# Patient Record
Sex: Female | Born: 1965 | Race: White | Hispanic: No | Marital: Single | Smoking: Former smoker
Health system: Southern US, Community
[De-identification: ages and names within clinical notes are randomized; demographics above are authoritative.]

## PROBLEM LIST (undated history)

## (undated) DIAGNOSIS — K219 Gastro-esophageal reflux disease without esophagitis: Secondary | ICD-10-CM

## (undated) DIAGNOSIS — I2510 Atherosclerotic heart disease of native coronary artery without angina pectoris: Secondary | ICD-10-CM

## (undated) DIAGNOSIS — G4730 Sleep apnea, unspecified: Secondary | ICD-10-CM

## (undated) DIAGNOSIS — J349 Unspecified disorder of nose and nasal sinuses: Secondary | ICD-10-CM

## (undated) DIAGNOSIS — E785 Hyperlipidemia, unspecified: Secondary | ICD-10-CM

## (undated) DIAGNOSIS — I10 Essential (primary) hypertension: Secondary | ICD-10-CM

## (undated) DIAGNOSIS — Z8616 Personal history of COVID-19: Secondary | ICD-10-CM

## (undated) HISTORY — DX: Essential (primary) hypertension: I10

## (undated) HISTORY — DX: Unspecified disorder of nose and nasal sinuses: J34.9

## (undated) HISTORY — DX: Sleep apnea, unspecified: G47.30

## (undated) HISTORY — DX: Hyperlipidemia, unspecified: E78.5

## (undated) HISTORY — DX: Gastro-esophageal reflux disease without esophagitis: K21.9

## (undated) HISTORY — DX: Atherosclerotic heart disease of native coronary artery without angina pectoris: I25.10

## (undated) HISTORY — PX: WISDOM TOOTH EXTRACTION: SHX21

## (undated) HISTORY — DX: Personal history of COVID-19: Z86.16

## (undated) HISTORY — PX: COLPORRHAPHY: SHX921

---

## 2010-11-06 ENCOUNTER — Encounter: Admission: RE | Admit: 2010-11-06 | Discharge: 2010-11-06 | Payer: Self-pay | Admitting: Family Medicine

## 2010-11-21 ENCOUNTER — Encounter
Admission: RE | Admit: 2010-11-21 | Discharge: 2010-11-21 | Payer: Self-pay | Source: Home / Self Care | Attending: Family Medicine | Admitting: Family Medicine

## 2011-05-09 ENCOUNTER — Other Ambulatory Visit: Payer: Self-pay | Admitting: Family Medicine

## 2011-05-09 ENCOUNTER — Ambulatory Visit
Admission: RE | Admit: 2011-05-09 | Discharge: 2011-05-09 | Disposition: A | Discharge status: Home / Self Care | Payer: 59 | Source: Ambulatory Visit | Attending: Family Medicine | Admitting: Family Medicine

## 2011-05-09 DIAGNOSIS — R1310 Dysphagia, unspecified: Secondary | ICD-10-CM

## 2011-05-13 ENCOUNTER — Other Ambulatory Visit: Payer: Self-pay | Admitting: Family Medicine

## 2011-05-13 DIAGNOSIS — K219 Gastro-esophageal reflux disease without esophagitis: Secondary | ICD-10-CM

## 2011-05-14 ENCOUNTER — Ambulatory Visit
Admission: RE | Admit: 2011-05-14 | Discharge: 2011-05-14 | Disposition: A | Discharge status: Home / Self Care | Payer: 59 | Source: Ambulatory Visit | Attending: Family Medicine | Admitting: Family Medicine

## 2011-05-14 DIAGNOSIS — K219 Gastro-esophageal reflux disease without esophagitis: Secondary | ICD-10-CM

## 2012-01-20 ENCOUNTER — Other Ambulatory Visit: Payer: Self-pay | Admitting: Family Medicine

## 2012-01-20 DIAGNOSIS — Z1231 Encounter for screening mammogram for malignant neoplasm of breast: Secondary | ICD-10-CM

## 2012-01-30 ENCOUNTER — Ambulatory Visit
Admission: RE | Admit: 2012-01-30 | Discharge: 2012-01-30 | Disposition: A | Discharge status: Home / Self Care | Payer: 59 | Source: Ambulatory Visit | Attending: Family Medicine | Admitting: Family Medicine

## 2012-01-30 DIAGNOSIS — Z1231 Encounter for screening mammogram for malignant neoplasm of breast: Secondary | ICD-10-CM

## 2013-03-28 ENCOUNTER — Other Ambulatory Visit (HOSPITAL_COMMUNITY)
Admission: RE | Admit: 2013-03-28 | Discharge: 2013-03-28 | Disposition: A | Discharge status: Home / Self Care | Payer: 59 | Source: Ambulatory Visit | Attending: Family Medicine | Admitting: Family Medicine

## 2013-03-28 ENCOUNTER — Other Ambulatory Visit: Payer: Self-pay | Admitting: Family Medicine

## 2013-03-28 DIAGNOSIS — Z124 Encounter for screening for malignant neoplasm of cervix: Secondary | ICD-10-CM | POA: Insufficient documentation

## 2013-03-29 ENCOUNTER — Ambulatory Visit
Admission: RE | Admit: 2013-03-29 | Discharge: 2013-03-29 | Disposition: A | Discharge status: Home / Self Care | Payer: 59 | Source: Ambulatory Visit | Attending: Family Medicine | Admitting: Family Medicine

## 2013-04-08 ENCOUNTER — Other Ambulatory Visit: Payer: 59

## 2013-05-16 ENCOUNTER — Ambulatory Visit: Payer: Self-pay | Admitting: Nurse Practitioner

## 2013-05-16 ENCOUNTER — Encounter: Payer: Self-pay | Admitting: Nurse Practitioner

## 2014-03-06 ENCOUNTER — Other Ambulatory Visit: Payer: Self-pay

## 2014-03-06 DIAGNOSIS — Z1231 Encounter for screening mammogram for malignant neoplasm of breast: Secondary | ICD-10-CM

## 2014-04-03 ENCOUNTER — Ambulatory Visit
Admission: RE | Admit: 2014-04-03 | Discharge: 2014-04-03 | Disposition: A | Discharge status: Home / Self Care | Payer: 59 | Source: Ambulatory Visit

## 2014-04-03 DIAGNOSIS — Z1231 Encounter for screening mammogram for malignant neoplasm of breast: Secondary | ICD-10-CM

## 2014-05-26 ENCOUNTER — Other Ambulatory Visit: Payer: Self-pay | Admitting: Gastroenterology

## 2014-05-26 DIAGNOSIS — R6889 Other general symptoms and signs: Secondary | ICD-10-CM

## 2014-05-26 DIAGNOSIS — R1310 Dysphagia, unspecified: Secondary | ICD-10-CM

## 2014-05-26 DIAGNOSIS — R1011 Right upper quadrant pain: Secondary | ICD-10-CM

## 2014-05-26 DIAGNOSIS — R0989 Other specified symptoms and signs involving the circulatory and respiratory systems: Secondary | ICD-10-CM

## 2014-06-02 ENCOUNTER — Ambulatory Visit
Admission: RE | Admit: 2014-06-02 | Discharge: 2014-06-02 | Disposition: A | Discharge status: Home / Self Care | Payer: 59 | Source: Ambulatory Visit | Attending: Gastroenterology | Admitting: Gastroenterology

## 2014-06-02 DIAGNOSIS — R6889 Other general symptoms and signs: Secondary | ICD-10-CM

## 2014-06-02 DIAGNOSIS — R1011 Right upper quadrant pain: Secondary | ICD-10-CM

## 2014-06-02 DIAGNOSIS — R0989 Other specified symptoms and signs involving the circulatory and respiratory systems: Secondary | ICD-10-CM

## 2014-06-02 DIAGNOSIS — R1310 Dysphagia, unspecified: Secondary | ICD-10-CM

## 2016-06-25 ENCOUNTER — Other Ambulatory Visit: Payer: Self-pay | Admitting: Family Medicine

## 2016-06-25 DIAGNOSIS — Z1231 Encounter for screening mammogram for malignant neoplasm of breast: Secondary | ICD-10-CM

## 2016-07-01 ENCOUNTER — Ambulatory Visit
Admission: RE | Admit: 2016-07-01 | Discharge: 2016-07-01 | Disposition: A | Discharge status: Home / Self Care | Payer: 59 | Source: Ambulatory Visit | Attending: Family Medicine | Admitting: Family Medicine

## 2016-07-01 DIAGNOSIS — Z1231 Encounter for screening mammogram for malignant neoplasm of breast: Secondary | ICD-10-CM

## 2016-07-11 ENCOUNTER — Other Ambulatory Visit: Payer: Self-pay | Admitting: Gastroenterology

## 2016-07-11 DIAGNOSIS — R1011 Right upper quadrant pain: Secondary | ICD-10-CM

## 2016-07-21 ENCOUNTER — Other Ambulatory Visit: Payer: 59

## 2016-08-11 ENCOUNTER — Ambulatory Visit
Admission: RE | Admit: 2016-08-11 | Discharge: 2016-08-11 | Disposition: A | Discharge status: Home / Self Care | Payer: 59 | Source: Ambulatory Visit | Attending: Gastroenterology | Admitting: Gastroenterology

## 2016-08-11 DIAGNOSIS — R1011 Right upper quadrant pain: Secondary | ICD-10-CM

## 2016-09-04 ENCOUNTER — Other Ambulatory Visit (HOSPITAL_COMMUNITY): Payer: Self-pay | Admitting: Gastroenterology

## 2016-09-04 ENCOUNTER — Other Ambulatory Visit: Payer: Self-pay | Admitting: Gastroenterology

## 2016-09-04 DIAGNOSIS — R1011 Right upper quadrant pain: Secondary | ICD-10-CM

## 2016-09-19 ENCOUNTER — Ambulatory Visit (HOSPITAL_COMMUNITY): Payer: 59

## 2016-12-23 DIAGNOSIS — Z23 Encounter for immunization: Secondary | ICD-10-CM | POA: Diagnosis not present

## 2017-02-18 DIAGNOSIS — K625 Hemorrhage of anus and rectum: Secondary | ICD-10-CM | POA: Diagnosis not present

## 2017-02-18 DIAGNOSIS — R1011 Right upper quadrant pain: Secondary | ICD-10-CM | POA: Diagnosis not present

## 2017-02-27 ENCOUNTER — Encounter (HOSPITAL_COMMUNITY): Payer: 59

## 2017-03-10 ENCOUNTER — Ambulatory Visit (HOSPITAL_COMMUNITY): Payer: 59

## 2017-04-09 DIAGNOSIS — H5213 Myopia, bilateral: Secondary | ICD-10-CM | POA: Diagnosis not present

## 2017-04-09 DIAGNOSIS — H524 Presbyopia: Secondary | ICD-10-CM | POA: Diagnosis not present

## 2017-04-16 ENCOUNTER — Ambulatory Visit (HOSPITAL_COMMUNITY)
Admission: RE | Admit: 2017-04-16 | Discharge: 2017-04-16 | Disposition: A | Discharge status: Home / Self Care | Payer: 59 | Source: Ambulatory Visit | Attending: Gastroenterology | Admitting: Gastroenterology

## 2017-04-16 DIAGNOSIS — R1011 Right upper quadrant pain: Secondary | ICD-10-CM | POA: Diagnosis present

## 2017-04-16 MED ORDER — TECHNETIUM TC 99M MEBROFENIN IV KIT
5.0000 | PACK | Freq: Once | INTRAVENOUS | Status: AC | PRN
  Administered 2017-04-16: 5 via INTRAVENOUS

## 2017-04-25 DIAGNOSIS — J3089 Other allergic rhinitis: Secondary | ICD-10-CM | POA: Diagnosis not present

## 2017-04-25 DIAGNOSIS — J0100 Acute maxillary sinusitis, unspecified: Secondary | ICD-10-CM | POA: Diagnosis not present

## 2017-05-05 DIAGNOSIS — R1011 Right upper quadrant pain: Secondary | ICD-10-CM | POA: Diagnosis not present

## 2017-05-05 DIAGNOSIS — K625 Hemorrhage of anus and rectum: Secondary | ICD-10-CM | POA: Diagnosis not present

## 2017-07-06 DIAGNOSIS — R1011 Right upper quadrant pain: Secondary | ICD-10-CM | POA: Diagnosis not present

## 2017-07-06 DIAGNOSIS — K625 Hemorrhage of anus and rectum: Secondary | ICD-10-CM | POA: Diagnosis not present

## 2017-07-06 DIAGNOSIS — K449 Diaphragmatic hernia without obstruction or gangrene: Secondary | ICD-10-CM | POA: Diagnosis not present

## 2017-09-09 DIAGNOSIS — M542 Cervicalgia: Secondary | ICD-10-CM | POA: Diagnosis not present

## 2017-09-09 DIAGNOSIS — R070 Pain in throat: Secondary | ICD-10-CM | POA: Diagnosis not present

## 2017-09-09 DIAGNOSIS — R221 Localized swelling, mass and lump, neck: Secondary | ICD-10-CM | POA: Diagnosis not present

## 2017-09-15 DIAGNOSIS — M2578 Osteophyte, vertebrae: Secondary | ICD-10-CM | POA: Diagnosis not present

## 2017-09-15 DIAGNOSIS — R070 Pain in throat: Secondary | ICD-10-CM | POA: Diagnosis not present

## 2017-09-15 DIAGNOSIS — M542 Cervicalgia: Secondary | ICD-10-CM | POA: Diagnosis not present

## 2017-09-30 DIAGNOSIS — J310 Chronic rhinitis: Secondary | ICD-10-CM | POA: Diagnosis not present

## 2017-09-30 DIAGNOSIS — R1313 Dysphagia, pharyngeal phase: Secondary | ICD-10-CM | POA: Diagnosis not present

## 2017-10-27 DIAGNOSIS — Z23 Encounter for immunization: Secondary | ICD-10-CM | POA: Diagnosis not present

## 2018-03-01 ENCOUNTER — Other Ambulatory Visit: Payer: Self-pay | Admitting: Family Medicine

## 2018-03-01 DIAGNOSIS — Z1231 Encounter for screening mammogram for malignant neoplasm of breast: Secondary | ICD-10-CM

## 2018-04-02 ENCOUNTER — Ambulatory Visit
Admission: RE | Admit: 2018-04-02 | Discharge: 2018-04-02 | Disposition: A | Discharge status: Home / Self Care | Payer: 59 | Source: Ambulatory Visit | Attending: Family Medicine | Admitting: Family Medicine

## 2018-04-02 DIAGNOSIS — Z1231 Encounter for screening mammogram for malignant neoplasm of breast: Secondary | ICD-10-CM | POA: Diagnosis not present

## 2018-10-27 DIAGNOSIS — Z23 Encounter for immunization: Secondary | ICD-10-CM | POA: Diagnosis not present

## 2018-12-04 DIAGNOSIS — L509 Urticaria, unspecified: Secondary | ICD-10-CM | POA: Diagnosis not present

## 2018-12-04 DIAGNOSIS — L299 Pruritus, unspecified: Secondary | ICD-10-CM | POA: Diagnosis not present

## 2018-12-13 DIAGNOSIS — H5213 Myopia, bilateral: Secondary | ICD-10-CM | POA: Diagnosis not present

## 2018-12-13 DIAGNOSIS — H40023 Open angle with borderline findings, high risk, bilateral: Secondary | ICD-10-CM | POA: Diagnosis not present

## 2018-12-16 DIAGNOSIS — R238 Other skin changes: Secondary | ICD-10-CM | POA: Diagnosis not present

## 2018-12-16 DIAGNOSIS — L299 Pruritus, unspecified: Secondary | ICD-10-CM | POA: Diagnosis not present

## 2018-12-16 DIAGNOSIS — Z13220 Encounter for screening for lipoid disorders: Secondary | ICD-10-CM | POA: Diagnosis not present

## 2018-12-16 DIAGNOSIS — R21 Rash and other nonspecific skin eruption: Secondary | ICD-10-CM | POA: Diagnosis not present

## 2018-12-20 ENCOUNTER — Telehealth: Payer: Self-pay | Admitting: *Deleted

## 2018-12-20 ENCOUNTER — Encounter: Payer: Self-pay | Admitting: Allergy and Immunology

## 2018-12-20 ENCOUNTER — Ambulatory Visit (INDEPENDENT_AMBULATORY_CARE_PROVIDER_SITE_OTHER): Payer: 59 | Admitting: Allergy and Immunology

## 2018-12-20 VITALS — BP 110/70 | HR 72 | Resp 16 | Ht 64.0 in | Wt 192.0 lb

## 2018-12-20 DIAGNOSIS — J3089 Other allergic rhinitis: Secondary | ICD-10-CM

## 2018-12-20 DIAGNOSIS — T7840XD Allergy, unspecified, subsequent encounter: Secondary | ICD-10-CM

## 2018-12-20 DIAGNOSIS — H1013 Acute atopic conjunctivitis, bilateral: Secondary | ICD-10-CM

## 2018-12-20 DIAGNOSIS — L500 Allergic urticaria: Secondary | ICD-10-CM | POA: Insufficient documentation

## 2018-12-20 DIAGNOSIS — H1010 Acute atopic conjunctivitis, unspecified eye: Secondary | ICD-10-CM | POA: Insufficient documentation

## 2018-12-20 DIAGNOSIS — R062 Wheezing: Secondary | ICD-10-CM | POA: Insufficient documentation

## 2018-12-20 DIAGNOSIS — K219 Gastro-esophageal reflux disease without esophagitis: Secondary | ICD-10-CM

## 2018-12-20 MED ORDER — ALCAFTADINE 0.25 % OP SOLN: 1.0000 [drp] | Freq: Every day | OPHTHALMIC | 5 refills | Status: DC

## 2018-12-20 MED ORDER — AZELASTINE-FLUTICASONE 137-50 MCG/ACT NA SUSP: 2.0000 | NASAL | 5 refills | Status: DC

## 2018-12-20 MED ORDER — FAMOTIDINE 20 MG PO TABS: 20.0000 mg | ORAL_TABLET | Freq: Two times a day (BID) | ORAL | 5 refills | Status: DC

## 2018-12-20 MED ORDER — MONTELUKAST SODIUM 10 MG PO TABS: 10.0000 mg | ORAL_TABLET | Freq: Every day | ORAL | 5 refills | Status: DC

## 2018-12-20 MED ORDER — ALBUTEROL SULFATE HFA 108 (90 BASE) MCG/ACT IN AERS: 2.0000 | INHALATION_SPRAY | Freq: Four times a day (QID) | RESPIRATORY_TRACT | 2 refills | Status: DC | PRN

## 2018-12-20 MED ORDER — LEVOCETIRIZINE DIHYDROCHLORIDE 5 MG PO TABS: 5.0000 mg | ORAL_TABLET | Freq: Every evening | ORAL | 5 refills | Status: DC

## 2018-12-20 MED ORDER — OLOPATADINE HCL 0.1 % OP SOLN: 1.0000 [drp] | Freq: Two times a day (BID) | OPHTHALMIC | 5 refills | Status: DC

## 2018-12-20 NOTE — Telephone Encounter (Signed)
Script sent into pharmacy 

## 2018-12-20 NOTE — Telephone Encounter (Signed)
Patient's insurance denied Patanol. Insurance prefers [employer]. Please advise.

## 2018-12-20 NOTE — Telephone Encounter (Signed)
Lastacaft is fine. 1 drop per eye daily as needed.

## 2018-12-20 NOTE — Assessment & Plan Note (Signed)
   Aeroallergen avoidance measures have been discussed and provided in written form.  Levocetirizine and montelukast have been prescribed (as above).  A prescription has been provided for Dymista (azelastine/fluticasone) nasal spray, 1 spray per nostril twice daily as needed. Proper nasal spray technique has been discussed and demonstrated.  Nasal saline lavage (NeilMed) has been recommended as needed and prior to medicated nasal sprays along with instructions for proper administration.  For thick post nasal drainage, nasal congestion, and/or sinus pressure, add guaifenesin [phone] mg (Mucinex) plus/minus pseudoephedrine 60-120 mg  twice daily as needed with adequate hydration as discussed. Pseudoephedrine is only to be used for short-term relief of nasal/sinus congestion. Long-term use is discouraged due to potential side effects.  If allergen avoidance measures and medications fail to adequately relieve symptoms, aeroallergen immunotherapy will be considered.

## 2018-12-20 NOTE — Assessment & Plan Note (Signed)
   Montelukast has been prescribed (as above).  A prescription has been provided for albuterol HFA, 1 to 2 inhalations every 4-6 hours as needed.  Subjective and objective measures of pulmonary function will be followed and the treatment plan will be adjusted accordingly.

## 2018-12-20 NOTE — Assessment & Plan Note (Signed)
   Treatment plan as outlined above for allergic rhinitis.  A prescription has been provided for Pataday, one drop per eye daily as needed.  I have also recommended eye lubricant drops (i.e., Natural Tears) as needed. 

## 2018-12-20 NOTE — Addendum Note (Signed)
Addended by: Horris Latino on: 12/20/2018 04:52 PM   Modules accepted: Orders

## 2018-12-20 NOTE — Assessment & Plan Note (Addendum)
Unclear etiology. Skin tests to select food allergens were negative today. NSAIDs and emotional stress commonly exacerbate urticaria but are not the underlying etiology in this case. Physical urticarias are negative by history (i.e. pressure-induced, temperature, vibration, solar, etc.). There are no concomitant symptoms concerning for anaphylaxis. We will rule out other potential etiologies with labs. For symptom relief, patient is to take oral antihistamines as directed.  The following labs have been ordered: FCeRI antibody, anti-thyroglobulin antibody, thyroid peroxidase antibody, tryptase, urea breath test, CBC, CMP, ESR, ANA, and galactose-alpha-1,3-galactose IgE level.  The patient will be called with further recommendations after lab results have returned.  Instructions have been discussed and provided for H1/H2 receptor blockade with titration to find lowest effective dose.  A prescription has been provided for levocetirizine, 5 mg daily as needed.  A prescription has been provided for famotidine 20 mg twice daily as needed.  A prescription has been provided for montelukast 10 mg daily at bedtime.  Should there be a significant increase or change in symptoms, a journal is to be kept recording any foods eaten, beverages consumed, medications taken within a 6 hour period prior to the onset of symptoms, as well as record activities being performed, and environmental conditions. For any symptoms concerning for anaphylaxis, 911 is to be called immediately.

## 2018-12-20 NOTE — Progress Notes (Signed)
New Patient Note  RE: Ruth Roberts MRN: [id number] DOB: [birth year] Date of Office Visit: 12/20/2018  Referring provider: Inetta Fermo, MD Primary care provider: Leighton Ruff, MD  Chief Complaint: Urticaria; Nasal Congestion; and Sinus Problem   History of present illness: Ruth Roberts is a 53 y.o. female seen today in consultation requested by Inetta Fermo, MD.  Over the past 6 weeks, [name] has experienced recurrent episodes of hives. The hives have appeared at different times over her chest, back, arms and legs.  The lesions are described as erythematous, raised, and pruritic.  Individual hives last less than 24 hours without leaving residual pigmentation or bruising. She denies concomitant angioedema, cardiopulmonary symptoms and GI symptoms. She has not experienced unexpected weight loss, recurrent fevers or drenching night sweats. No specific medication, food, skin care product, detergent, soap, or other environmental triggers have been identified. The symptoms do not seem to correlate with NSAIDs use or emotional stress. She did not have symptoms consistent with a respiratory tract infection at the time of symptom onset. Ruth Roberts has tried to control symptoms with benadryl or fexofenadine or sesame seed oil which have offered moderate temporary relief. She has not been evaluated and treated in the emergency department for these symptoms. Skin biopsy has not been performed. Recently, she has been experiencing hives daily.  Her mother had rheumatoid arthritis and scleroderma and her brother has celiac, though she denies symptoms associated with those autoimmune diseases.  She has a history of ulcer and takes Nexium daily to control acid reflux. Ruth Roberts experiences nasal congestion, rhinorrhea, sneezing, thick postnasal drainage, nasal pruritus, ocular pruritus, ear pressure, and sinus pressure.  These symptoms occur year around but tend to be more frequent and severe with pollen exposure in the  springtime as well as in the fall.  She attempts to control the nasal/sinus/ocular symptoms with nasal saline irrigation, Nasacort AQ, and fexofenadine.  She admits to occasional mild "allergic wheezing".  The wheezing typically occurs in the springtime when she is exposed to tree pollen.  Assessment and plan: Recurrent urticaria Unclear etiology. Skin tests to select food allergens were negative today. NSAIDs and emotional stress commonly exacerbate urticaria but are not the underlying etiology in this case. Physical urticarias are negative by history (i.e. pressure-induced, temperature, vibration, solar, etc.). There are no concomitant symptoms concerning for anaphylaxis. We will rule out other potential etiologies with labs. For symptom relief, patient is to take oral antihistamines as directed.  The following labs have been ordered: FCeRI antibody, anti-thyroglobulin antibody, thyroid peroxidase antibody, tryptase, urea breath test, CBC, CMP, ESR, ANA, and galactose-alpha-1,3-galactose IgE level.  The patient will be called with further recommendations after lab results have returned.  Instructions have been discussed and provided for H1/H2 receptor blockade with titration to find lowest effective dose.  A prescription has been provided for levocetirizine, 5 mg daily as needed.  A prescription has been provided for famotidine 20 mg twice daily as needed.  A prescription has been provided for montelukast 10 mg daily at bedtime.  Should there be a significant increase or change in symptoms, a journal is to be kept recording any foods eaten, beverages consumed, medications taken within a 6 hour period prior to the onset of symptoms, as well as record activities being performed, and environmental conditions. For any symptoms concerning for anaphylaxis, 911 is to be called immediately.  Perennial and seasonal allergic rhinosinusitis  Aeroallergen avoidance measures have been discussed and  provided in written form.  Levocetirizine and montelukast  have been prescribed (as above).  A prescription has been provided for Dymista (azelastine/fluticasone) nasal spray, 1 spray per nostril twice daily as needed. Proper nasal spray technique has been discussed and demonstrated.  Nasal saline lavage (NeilMed) has been recommended as needed and prior to medicated nasal sprays along with instructions for proper administration.  For thick post nasal drainage, nasal congestion, and/or sinus pressure, add guaifenesin [phone] mg (Mucinex) plus/minus pseudoephedrine 60-120 mg  twice daily as needed with adequate hydration as discussed. Pseudoephedrine is only to be used for short-term relief of nasal/sinus congestion. Long-term use is discouraged due to potential side effects.  If allergen avoidance measures and medications fail to adequately relieve symptoms, aeroallergen immunotherapy will be considered.  Allergic conjunctivitis  Treatment plan as outlined above for allergic rhinitis.  A prescription has been provided for Pataday, one drop per eye daily as needed.  I have also recommended eye lubricant drops (i.e., Natural Tears) as needed.  Allergic bronchial hyperresponsiveness  Montelukast has been prescribed (as above).  A prescription has been provided for albuterol HFA, 1 to 2 inhalations every 4-6 hours as needed.  Subjective and objective measures of pulmonary function will be followed and the treatment plan will be adjusted accordingly.   Meds ordered this encounter  Medications  . levocetirizine (XYZAL) 5 MG tablet    Sig: Take 1 tablet (5 mg total) by mouth every evening.    Dispense:  30 tablet    Refill:  5  . famotidine (PEPCID) 20 MG tablet    Sig: Take 1 tablet (20 mg total) by mouth 2 (two) times daily.    Dispense:  60 tablet    Refill:  5  . montelukast (SINGULAIR) 10 MG tablet    Sig: Take 1 tablet (10 mg total) by mouth at bedtime.    Dispense:  30 tablet     Refill:  5  . Azelastine-Fluticasone (DYMISTA) 137-50 MCG/ACT SUSP    Sig: Place 2 sprays into both nostrils 1 day or 1 dose.    Dispense:  1 Bottle    Refill:  5  . olopatadine (PATANOL) 0.1 % ophthalmic solution    Sig: Place 1 drop into both eyes 2 (two) times daily.    Dispense:  5 mL    Refill:  5  . albuterol (PROVENTIL HFA;VENTOLIN HFA) 108 (90 Base) MCG/ACT inhaler    Sig: Inhale 2 puffs into the lungs every 6 (six) hours as needed for wheezing or shortness of breath.    Dispense:  1 Inhaler    Refill:  2    Diagnostics: Spirometry: Spirometry:  Normal with an FEV1 of 88% predicted with an FEV1 ratio of 96%.  This study was performed while the patient was asymptomatic.  Please see scanned spirometry results for details. Environmental skin testing: Positive to grass pollen, weed pollen, ragweed pollen, tree pollen, molds, cat hair, dog epithelia, cockroach antigen, and dust mite antigen. Food allergen skin testing: Negative despite a positive histamine control.    Physical examination: Blood pressure 110/70, pulse 72, resp. rate 16, height 5' 4"  (1.626 m), weight 192 lb (87.1 kg), SpO2 97 %.  General: Alert, interactive, in no acute distress. HEENT: TMs pearly gray, turbinates moderately edematous with thick discharge, post-pharynx moderately erythematous. Neck: Supple without lymphadenopathy. Lungs: Clear to auscultation without wheezing, rhonchi or rales. CV: Normal S1, S2 without murmurs. Abdomen: Nondistended, nontender. Skin: Warm and dry, without lesions or rashes. Extremities:  No clubbing, cyanosis or edema. Neuro:   Grossly  intact.  Review of systems:  Review of systems negative except as noted in HPI / PMHx or noted below: Review of Systems  Constitutional: Negative.   HENT: Negative.   Eyes: Negative.   Respiratory: Negative.   Cardiovascular: Negative.   Gastrointestinal: Negative.   Genitourinary: Negative.   Musculoskeletal: Negative.   Skin:  Negative.   Neurological: Negative.   Endo/Heme/Allergies: Negative.   Psychiatric/Behavioral: Negative.     Past medical history:  Past Medical History:  Diagnosis Date  . Sinus trouble     Past surgical history:  History reviewed. No pertinent surgical history.  Family history: Family History  Problem Relation Age of Onset  . Lung cancer Father   . Breast cancer Other   . Colon cancer Other     Social history: Social History   Socioeconomic History  . Marital status: Single    Spouse name: Not on file  . Number of children: Not on file  . Years of education: Not on file  . Highest education level: Not on file  Occupational History  . Not on file  Social Needs  . Financial resource strain: Not on file  . Food insecurity:    Worry: Not on file    Inability: Not on file  . Transportation needs:    Medical: Not on file    Non-medical: Not on file  Tobacco Use  . Smoking status: Former Smoker    Packs/day: 0.25    Years: 25.00    Pack years: 6.25  . Smokeless tobacco: Never Used  . Tobacco comment: social   Substance and Sexual Activity  . Alcohol use: Yes    Comment: rare  . Drug use: No  . Sexual activity: Not on file  Lifestyle  . Physical activity:    Days per week: Not on file    Minutes per session: Not on file  . Stress: Not on file  Relationships  . Social connections:    Talks on phone: Not on file    Gets together: Not on file    Attends religious service: Not on file    Active member of club or organization: Not on file    Attends meetings of clubs or organizations: Not on file    Relationship status: Not on file  . Intimate partner violence:    Fear of current or ex partner: Not on file    Emotionally abused: Not on file    Physically abused: Not on file    Forced sexual activity: Not on file  Other Topics Concern  . Not on file  Social History Narrative   Patient is single and lives at home alone. Patient works at a [employer].  Patient has a high school education.   Environmental History: Patient lives in a 51 year old house with carpeting in the bedroom and central air/heat.  There is no known mold/water damage in the home.  There are no pets in the home.  She is a former cigarette smoker having started in 1984 quit in 2013.  Allergies as of 12/20/2018   No Known Allergies     Medication List       Accurate as of December 20, 2018  4:45 PM. Always use your most recent med list.        albuterol 108 (90 Base) MCG/ACT inhaler Commonly known as:  PROVENTIL HFA;VENTOLIN HFA Inhale 2 puffs into the lungs every 6 (six) hours as needed for wheezing or shortness of breath.   Azelastine-Fluticasone  137-50 MCG/ACT Susp Commonly known as:  DYMISTA Place 2 sprays into both nostrils 1 day or 1 dose.   CITRACAL PLUS Tabs Take 1 tablet by mouth daily.   famotidine 20 MG tablet Commonly known as:  PEPCID Take 1 tablet (20 mg total) by mouth 2 (two) times daily.   fexofenadine 180 MG tablet Commonly known as:  ALLEGRA Take 180 mg by mouth daily.   levocetirizine 5 MG tablet Commonly known as:  XYZAL Take 1 tablet (5 mg total) by mouth every evening.   montelukast 10 MG tablet Commonly known as:  SINGULAIR Take 1 tablet (10 mg total) by mouth at bedtime.   multivitamin capsule Take 1 capsule by mouth daily.   NEXIUM 40 MG capsule Generic drug:  esomeprazole   olopatadine 0.1 % ophthalmic solution Commonly known as:  PATANOL Place 1 drop into both eyes 2 (two) times daily.   PA VITAMIN D-3 GUMMY PO Take 1,000 Units by mouth daily.   SUMAtriptan 50 MG tablet Commonly known as:  IMITREX Take 50 mg by mouth every 2 (two) hours as needed for migraine.       Known medication allergies: No Known Allergies  I appreciate the opportunity to take part in Loralei's care. Please do not hesitate to contact me with questions.  Sincerely,   R. Edgar Frisk, MD

## 2018-12-20 NOTE — Patient Instructions (Addendum)
Recurrent urticaria Unclear etiology. Skin tests to select food allergens were negative today. NSAIDs and emotional stress commonly exacerbate urticaria but are not the underlying etiology in this case. Physical urticarias are negative by history (i.e. pressure-induced, temperature, vibration, solar, etc.). There are no concomitant symptoms concerning for anaphylaxis. We will rule out other potential etiologies with labs. For symptom relief, patient is to take oral antihistamines as directed.  The following labs have been ordered: FCeRI antibody, anti-thyroglobulin antibody, thyroid peroxidase antibody, tryptase, urea breath test, CBC, CMP, ESR, ANA, and galactose-alpha-1,3-galactose IgE level.  The patient will be called with further recommendations after lab results have returned.  Instructions have been discussed and provided for H1/H2 receptor blockade with titration to find lowest effective dose.  A prescription has been provided for levocetirizine, 5 mg daily as needed.  A prescription has been provided for famotidine 20 mg twice daily as needed.  A prescription has been provided for montelukast 10 mg daily at bedtime.  Should there be a significant increase or change in symptoms, a journal is to be kept recording any foods eaten, beverages consumed, medications taken within a 6 hour period prior to the onset of symptoms, as well as record activities being performed, and environmental conditions. For any symptoms concerning for anaphylaxis, 911 is to be called immediately.  Perennial and seasonal allergic rhinosinusitis  Aeroallergen avoidance measures have been discussed and provided in written form.  Levocetirizine and montelukast have been prescribed (as above).  A prescription has been provided for Dymista (azelastine/fluticasone) nasal spray, 1 spray per nostril twice daily as needed. Proper nasal spray technique has been discussed and demonstrated.  Nasal saline lavage (NeilMed)  has been recommended as needed and prior to medicated nasal sprays along with instructions for proper administration.  For thick post nasal drainage, nasal congestion, and/or sinus pressure, add guaifenesin [phone] mg (Mucinex) plus/minus pseudoephedrine 60-120 mg  twice daily as needed with adequate hydration as discussed. Pseudoephedrine is only to be used for short-term relief of nasal/sinus congestion. Long-term use is discouraged due to potential side effects.  If allergen avoidance measures and medications fail to adequately relieve symptoms, aeroallergen immunotherapy will be considered.  Allergic conjunctivitis  Treatment plan as outlined above for allergic rhinitis.  A prescription has been provided for Pataday, one drop per eye daily as needed.  I have also recommended eye lubricant drops (i.e., Natural Tears) as needed.  Allergic bronchial hyperresponsiveness  Montelukast has been prescribed (as above).  A prescription has been provided for albuterol HFA, 1 to 2 inhalations every 4-6 hours as needed.  Subjective and objective measures of pulmonary function will be followed and the treatment plan will be adjusted accordingly.   When lab results have returned the patient will be called with further recommendations and follow up instructions.  Urticaria (Hives)  . Levocetirizine (Xyzal) 5 mg twice a day and famotidine (Pepcid) 20 mg twice a day. If no symptoms for 7-14 days then decrease to. . Levocetirizine (Xyzal) 5 mg twice a day and famotidine (Pepcid) 20 mg once a day.  If no symptoms for 7-14 days then decrease to. . Levocetirizine (Xyzal) 5 mg twice a day.  If no symptoms for 7-14 days then decrease to. . Levocetirizine (Xyzal) 5 mg once a day.  May use Benadryl (diphenhydramine) as needed for breakthrough symptoms       If symptoms return, then step up dosage   Control of Dust Mite Allergen  House dust mites play a major role in allergic asthma  and rhinitis.   They occur in environments with high humidity wherever human skin, the food for dust mites is found. High levels have been detected in dust obtained from mattresses, pillows, carpets, upholstered furniture, bed covers, clothes and soft toys.  The principal allergen of the house dust mite is found in its feces.  A gram of dust may contain 1,000 mites and 250,000 fecal particles.  Mite antigen is easily measured in the air during house cleaning activities.    1. Encase mattresses, including the box spring, and pillow, in an air tight cover.  Seal the zipper end of the encased mattresses with wide adhesive tape. 2. Wash the bedding in water of 130 degrees Farenheit weekly.  Avoid cotton comforters/quilts and flannel bedding: the most ideal bed covering is the dacron comforter. 3. Remove all upholstered furniture from the bedroom. 4. Remove carpets, carpet padding, rugs, and non-washable window drapes from the bedroom.  Wash drapes weekly or use plastic window coverings. 5. Remove all non-washable stuffed toys from the bedroom.  Wash stuffed toys weekly. 6. Have the room cleaned frequently with a vacuum cleaner and a damp dust-mop.  The patient should not be in a room which is being cleaned and should wait 1 hour after cleaning before going into the room. 7. Close and seal all heating outlets in the bedroom.  Otherwise, the room will become filled with dust-laden air.  An electric heater can be used to heat the room. Reduce indoor humidity to less than 50%.  Do not use a humidifier.   Reducing Pollen Exposure  The American Academy of Allergy, Asthma and Immunology suggests the following steps to reduce your exposure to pollen during allergy seasons.    1. Do not hang sheets or clothing out to dry; pollen may collect on these items. 2. Do not mow lawns or spend time around freshly cut grass; mowing stirs up pollen. 3. Keep windows closed at night.  Keep car windows closed while driving. 4. Minimize  morning activities outdoors, a time when pollen counts are usually at their highest. 5. Stay indoors as much as possible when pollen counts or humidity is high and on windy days when pollen tends to remain in the air longer. 6. Use air conditioning when possible.  Many air conditioners have filters that trap the pollen spores. 7. Use a HEPA room air filter to remove pollen form the indoor air you breathe.   Control of Dog or Cat Allergen  Avoidance is the best way to manage a dog or cat allergy. If you have a dog or cat and are allergic to dog or cats, consider removing the dog or cat from the home. If you have a dog or cat but don't want to find it a new home, or if your family wants a pet even though someone in the household is allergic, here are some strategies that may help keep symptoms at bay:  1. Keep the pet out of your bedroom and restrict it to only a few rooms. Be advised that keeping the dog or cat in only one room will not limit the allergens to that room. 2. Don't pet, hug or kiss the dog or cat; if you do, wash your hands with soap and water. 3. High-efficiency particulate air (HEPA) cleaners run continuously in a bedroom or living room can reduce allergen levels over time. 4. Place electrostatic material sheet in the air inlet vent in the bedroom. 5. Regular use of a high-efficiency vacuum cleaner  or a central vacuum can reduce allergen levels. 6. Giving your dog or cat a bath at least once a week can reduce airborne allergen.   Control of Mold Allergen  Mold and fungi can grow on a variety of surfaces provided certain temperature and moisture conditions exist.  Outdoor molds grow on plants, decaying vegetation and soil.  The major outdoor mold, Alternaria and Cladosporium, are found in very high numbers during hot and dry conditions.  Generally, a late Summer - Fall peak is seen for common outdoor fungal spores.  Rain will temporarily lower outdoor mold spore count, but counts  rise rapidly when the rainy period ends.  The most important indoor molds are Aspergillus and Penicillium.  Dark, humid and poorly ventilated basements are ideal sites for mold growth.  The next most common sites of mold growth are the bathroom and the kitchen.  Outdoor [employer] 1. Use air conditioning and keep windows closed 2. Avoid exposure to decaying vegetation. 3. Avoid leaf raking. 4. Avoid grain handling. 5. Consider wearing a face mask if working in moldy areas.  Indoor Mold Control 1. Maintain humidity below 50%. 2. Clean washable surfaces with 5% bleach solution. 3. Remove sources e.g. Contaminated carpets.   Control of Cockroach Allergen  Cockroach allergen has been identified as an important cause of acute attacks of asthma, especially in urban settings.  There are fifty-five species of cockroach that exist in the [place], however only three, the [place], [place] species produce allergen that can affect patients with Asthma.  Allergens can be obtained from fecal particles, egg casings and secretions from cockroaches.    1. Remove food sources. 2. Reduce access to water. 3. Seal access and entry points. 4. Spray runways with 0.5-1% Diazinon or Chlorpyrifos 5. Blow boric acid power under stoves and refrigerator. 6. Place bait stations (hydramethylnon) at feeding sites.

## 2018-12-23 ENCOUNTER — Telehealth: Payer: Self-pay | Admitting: Allergy and Immunology

## 2018-12-23 MED ORDER — AZELASTINE HCL 0.1 % NA SOLN: 2.0000 | Freq: Two times a day (BID) | NASAL | 5 refills | Status: DC

## 2018-12-23 MED ORDER — FLUTICASONE PROPIONATE 50 MCG/ACT NA SUSP: 2.0000 | Freq: Every day | NASAL | 5 refills | Status: DC

## 2018-12-23 NOTE — Telephone Encounter (Signed)
I spoke with the pharmacy and he informed me that the azelastine & fluticasone were ready for pick up and the patient is aware.

## 2018-12-23 NOTE — Telephone Encounter (Signed)
Pt was calling and said that ins would not cover Dymista . [facility] [phone].

## 2018-12-23 NOTE — Telephone Encounter (Signed)
Prescription has been split.

## 2018-12-23 NOTE — Telephone Encounter (Signed)
Need to have dymista called into [facility] . [phone].

## 2018-12-25 LAB — COMPREHENSIVE METABOLIC PANEL
ALT: 15 IU/L (ref 0–32)
AST: 19 IU/L (ref 0–40)
Albumin/Globulin Ratio: 1.8 (ref 1.2–2.2)
Albumin: 4.6 g/dL (ref 3.5–5.5)
Alkaline Phosphatase: 65 IU/L (ref 39–117)
BUN/Creatinine Ratio: 13 (ref 9–23)
BUN: 10 mg/dL (ref 6–24)
Bilirubin Total: 0.4 mg/dL (ref 0.0–1.2)
CO2: 23 mmol/L (ref 20–29)
Calcium: 9.7 mg/dL (ref 8.7–10.2)
Chloride: 98 mmol/L (ref 96–106)
Creatinine, Ser: 0.76 mg/dL (ref 0.57–1.00)
GFR calc Af Amer: 106 mL/min/{1.73_m2} (ref >59)
GFR calc non Af Amer: 92 mL/min/{1.73_m2} (ref >59)
Globulin, Total: 2.6 g/dL (ref 1.5–4.5)
Glucose: 94 mg/dL (ref 65–99)
Potassium: 4.4 mmol/L (ref 3.5–5.2)
Sodium: 136 mmol/L (ref 134–144)
Total Protein: 7.2 g/dL (ref 6.0–8.5)

## 2018-12-25 LAB — ALPHA-GAL PANEL
Alpha Gal IgE*: <0.1 kU/L (ref <0.10)
Beef (Bos spp) IgE: <0.1 kU/L (ref <0.35)
Class Interpretation: 0
Class Interpretation: 0
Class Interpretation: 0
Lamb/Mutton (Ovis spp) IgE: <0.1 kU/L (ref <0.35)
Pork (Sus spp) IgE: <0.1 kU/L (ref <0.35)

## 2018-12-25 LAB — THYROGLOBULIN ANTIBODY: Thyroglobulin Antibody: <1 IU/mL (ref 0.0–0.9)

## 2018-12-25 LAB — SEDIMENTATION RATE: Sed Rate: 7 mm/hr (ref 0–40)

## 2018-12-25 LAB — ANA: Anti Nuclear Antibody(ANA): NEGATIVE

## 2018-12-25 LAB — THYROID PEROXIDASE ANTIBODY: Thyroperoxidase Ab SerPl-aCnc: <6 IU/mL (ref 0–34)

## 2018-12-25 LAB — CHRONIC URTICARIA: cu index: 28.6 — ABNORMAL HIGH (ref <10)

## 2018-12-29 ENCOUNTER — Telehealth: Payer: Self-pay | Admitting: *Deleted

## 2018-12-29 NOTE — Telephone Encounter (Signed)
No idea how to release them. Probably need to mail them.

## 2018-12-29 NOTE — Telephone Encounter (Signed)
The patient would like to view her lab results in [place]. Is there a possibilities that these can be released for her to view or do we need to mail her a copy of her lab results instead? Please advise.

## 2018-12-29 NOTE — Telephone Encounter (Signed)
Patient called stating she cannot see her lab results on mychart. Please advise [phone]

## 2018-12-30 NOTE — Telephone Encounter (Signed)
Is there a way to release this patient's lab results in [place]. Wanted to double check before having the results printed and mailed to the patient. Please send message back. Thank You.

## 2019-01-03 DIAGNOSIS — L500 Allergic urticaria: Secondary | ICD-10-CM | POA: Diagnosis not present

## 2019-01-03 DIAGNOSIS — K219 Gastro-esophageal reflux disease without esophagitis: Secondary | ICD-10-CM | POA: Diagnosis not present

## 2019-01-05 LAB — H. PYLORI BREATH TEST: H pylori Breath Test: NEGATIVE

## 2019-01-11 NOTE — Telephone Encounter (Signed)
Results were released to patient and patient has been informed.

## 2019-01-19 DIAGNOSIS — J0140 Acute pansinusitis, unspecified: Secondary | ICD-10-CM | POA: Diagnosis not present

## 2019-01-30 DIAGNOSIS — J45909 Unspecified asthma, uncomplicated: Secondary | ICD-10-CM | POA: Diagnosis not present

## 2019-02-08 DIAGNOSIS — L509 Urticaria, unspecified: Secondary | ICD-10-CM | POA: Diagnosis not present

## 2019-02-08 DIAGNOSIS — D229 Melanocytic nevi, unspecified: Secondary | ICD-10-CM | POA: Diagnosis not present

## 2019-02-13 DIAGNOSIS — J0100 Acute maxillary sinusitis, unspecified: Secondary | ICD-10-CM | POA: Diagnosis not present

## 2019-02-15 DIAGNOSIS — R05 Cough: Secondary | ICD-10-CM | POA: Diagnosis not present

## 2019-05-03 ENCOUNTER — Institutional Professional Consult (permissible substitution): Payer: 59 | Admitting: Pulmonary Disease

## 2019-05-13 ENCOUNTER — Institutional Professional Consult (permissible substitution): Payer: 59 | Admitting: Internal Medicine

## 2019-05-22 ENCOUNTER — Other Ambulatory Visit: Payer: Self-pay | Admitting: Allergy and Immunology

## 2019-06-10 ENCOUNTER — Institutional Professional Consult (permissible substitution): Payer: 59 | Admitting: Internal Medicine

## 2019-06-15 ENCOUNTER — Institutional Professional Consult (permissible substitution): Payer: 59 | Admitting: Pulmonary Disease

## 2019-06-22 ENCOUNTER — Other Ambulatory Visit: Payer: Self-pay | Admitting: Allergy and Immunology

## 2019-07-23 ENCOUNTER — Other Ambulatory Visit: Payer: Self-pay | Admitting: Allergy and Immunology

## 2019-08-05 ENCOUNTER — Telehealth: Payer: Self-pay

## 2019-08-05 NOTE — Telephone Encounter (Signed)
LMTCB. Patient has an appt 08/10/19 at 9am. Please as patient to come in at 10:30 on same day due to [facility] having a meeting that morning.

## 2019-08-09 NOTE — Telephone Encounter (Signed)
Appt has been r/s. Nothing further needed at this time.

## 2019-08-10 ENCOUNTER — Other Ambulatory Visit: Payer: Self-pay

## 2019-08-10 ENCOUNTER — Encounter: Payer: Self-pay | Admitting: Pulmonary Disease

## 2019-08-10 ENCOUNTER — Institutional Professional Consult (permissible substitution): Payer: 59 | Admitting: Pulmonary Disease

## 2019-08-10 ENCOUNTER — Ambulatory Visit (INDEPENDENT_AMBULATORY_CARE_PROVIDER_SITE_OTHER): Payer: 59 | Admitting: Pulmonary Disease

## 2019-08-10 VITALS — BP 126/84 | HR 64 | Temp 97.9°F | Ht 64.0 in | Wt 198.4 lb

## 2019-08-10 DIAGNOSIS — R062 Wheezing: Secondary | ICD-10-CM

## 2019-08-10 DIAGNOSIS — L508 Other urticaria: Secondary | ICD-10-CM | POA: Diagnosis not present

## 2019-08-10 DIAGNOSIS — J3089 Other allergic rhinitis: Secondary | ICD-10-CM | POA: Diagnosis not present

## 2019-08-10 DIAGNOSIS — L500 Allergic urticaria: Secondary | ICD-10-CM | POA: Diagnosis not present

## 2019-08-10 DIAGNOSIS — Z87891 Personal history of nicotine dependence: Secondary | ICD-10-CM

## 2019-08-10 NOTE — Progress Notes (Signed)
Synopsis: Referred in August 2020 for wheezing by Ruth Roberts*  Subjective:   PATIENT ID: Ruth Roberts GENDER: female DOB: [birth year], MRN: [id number]  Chief Complaint  Patient presents with  . Consult    Consult re: Cough. Recently seen allergy and asthma. She reports she did a spirometry and was told it was not good.     53 year old female with a past medical history of seasonal allergies, recurrent urticaria.  She has been seen by allergy who has done skin testing.  No significant food allergies.  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  Currently managed with antihistamines and montelukast.  Former smoker 25 years total, 10 of those years ~1 cigarette per day, and at a young age she smoked 1 ppd. Both father and mother died from lung cancer, both heavy smokers at younger ages but mother stopped smoking in her 59s and developed lung cancer in her 74s.  From a respiratory standpoint she has no significant symptoms.  Back in the springtime when this consult was originally placed she had had cough.  This cough has since subsided.  She has been taking her antihistamines regularly which has helped with her urticaria symptoms.  So far she has not had any in several months.  She does complain of sleep symptoms.  She feels like she has trouble waking from sleep.  She feels groggy with a headache in the morning.  She feels as if her jaw and teeth are tight in the morning as if she has been grinding or pushing on them all night long.  Sometimes she wakes up in the middle the night gasping.  She has been notified that she snores.  She would like to see a sleep doctor at some point.   Past Medical History:  Diagnosis Date  . Sinus trouble      Family History  Problem Relation Age of Onset  . Rheum arthritis Mother   . Scleroderma Mother   . Lung cancer Father   . Breast cancer Other   . Colon cancer Other   . Celiac disease Brother      No past surgical  history on file.  Social History   Socioeconomic History  . Marital status: Single    Spouse name: Not on file  . Number of children: Not on file  . Years of education: Not on file  . Highest education level: Not on file  Occupational History  . Not on file  Social Needs  . Financial resource strain: Not on file  . Food insecurity    Worry: Not on file    Inability: Not on file  . Transportation needs    Medical: Not on file    Non-medical: Not on file  Tobacco Use  . Smoking status: Former Smoker    Packs/day: 0.25    Years: 25.00    Pack years: 6.25    Quit date: 2013    Years since quitting: 7.6  . Smokeless tobacco: Never Used  . Tobacco comment: social   Substance and Sexual Activity  . Alcohol use: Yes    Comment: rare  . Drug use: No  . Sexual activity: Not on file  Lifestyle  . Physical activity    Days per week: Not on file    Minutes per session: Not on file  . Stress: Not on file  Relationships  . Social Herbalist on phone: Not on file    Gets together:  Not on file    Attends religious service: Not on file    Active member of club or organization: Not on file    Attends meetings of clubs or organizations: Not on file    Relationship status: Not on file  . Intimate partner violence    Fear of current or ex partner: Not on file    Emotionally abused: Not on file    Physically abused: Not on file    Forced sexual activity: Not on file  Other Topics Concern  . Not on file  Social History Narrative   Patient is single and lives at home alone. Patient works at a [employer]. Patient has a high school education.     No Known Allergies   Outpatient Medications Prior to Visit  Medication Sig Dispense Refill  . albuterol (VENTOLIN HFA) 108 (90 Base) MCG/ACT inhaler INHALE 2 PUFFS INTO THE LUNGS EVERY 6 HOURS AS NEEDED FOR WHEEZING OR SHORTNESS OF BREATH 6.7 g 0  . Calcium-Phosphorus-Vitamin D (CALCIUM/VITAMIN D3/ADULT GUMMY PO) Take by mouth  daily.    . famotidine (PEPCID) 20 MG tablet Take 1 tablet (20 mg total) by mouth 2 (two) times daily. 60 tablet 5  . fluticasone (FLONASE) 50 MCG/ACT nasal spray Place 2 sprays into both nostrils daily. 16 g 5  . levocetirizine (XYZAL) 5 MG tablet TAKE 1 TABLET(5 MG) BY MOUTH EVERY EVENING 30 tablet 0  . Multiple Vitamin (MULTIVITAMIN) capsule Take 1 capsule by mouth daily.    Marland Kitchen NEXIUM 40 MG capsule     . NON FORMULARY Citrucell Fiber Supplement    . olopatadine (PATANOL) 0.1 % ophthalmic solution Place 1 drop into both eyes 2 (two) times daily. (Patient not taking: Reported on 08/10/2019) 5 mL 5  . SUMAtriptan (IMITREX) 50 MG tablet Take 50 mg by mouth every 2 (two) hours as needed for migraine.    . Alcaftadine 0.25 % SOLN Apply 1 drop to eye daily. (Patient not taking: Reported on 08/10/2019) 3 mL 5  . azelastine (ASTELIN) 0.1 % nasal spray Place 2 sprays into both nostrils 2 (two) times daily. (Patient not taking: Reported on 08/10/2019) 30 mL 5  . Azelastine-Fluticasone (DYMISTA) 137-50 MCG/ACT SUSP Place 2 sprays into both nostrils 1 day or 1 dose. (Patient not taking: Reported on 08/10/2019) 1 Bottle 5  . Cholecalciferol (PA VITAMIN D-3 GUMMY PO) Take 1,000 Units by mouth daily.    . fexofenadine (ALLEGRA) 180 MG tablet Take 180 mg by mouth daily.    . montelukast (SINGULAIR) 10 MG tablet Take 1 tablet (10 mg total) by mouth at bedtime. (Patient not taking: Reported on 08/10/2019) 30 tablet 5  . Multiple Minerals-Vitamins (CITRACAL PLUS) TABS Take 1 tablet by mouth daily.     No facility-administered medications prior to visit.     Review of Systems  Constitutional: Negative for chills, fever, malaise/fatigue and weight loss.  HENT: Negative for hearing loss, sore throat and tinnitus.   Eyes: Negative for blurred vision and double vision.  Respiratory: Negative for cough, hemoptysis, sputum production, shortness of breath, wheezing and stridor.   Cardiovascular: Negative for chest pain,  palpitations, orthopnea, leg swelling and PND.  Gastrointestinal: Negative for abdominal pain, constipation, diarrhea, heartburn, nausea and vomiting.  Genitourinary: Negative for dysuria, hematuria and urgency.  Musculoskeletal: Negative for joint pain and myalgias.  Skin: Negative for itching and rash.  Neurological: Negative for dizziness, tingling, weakness and headaches.  Endo/Heme/Allergies: Negative for environmental allergies. Does not bruise/bleed easily.  Psychiatric/Behavioral:  Negative for depression. The patient is not nervous/anxious and does not have insomnia.   All other systems reviewed and are negative.    Objective:  Physical Exam Vitals signs reviewed.  Constitutional:      General: She is not in acute distress.    Appearance: She is well-developed.  HENT:     Head: Normocephalic and atraumatic.  Eyes:     General: No scleral icterus.    Conjunctiva/sclera: Conjunctivae normal.     Pupils: Pupils are equal, round, and reactive to light.  Neck:     Musculoskeletal: Neck supple.     Vascular: No JVD.     Trachea: No tracheal deviation.  Cardiovascular:     Rate and Rhythm: Normal rate and regular rhythm.     Heart sounds: Normal heart sounds. No murmur.  Pulmonary:     Effort: Pulmonary effort is normal. No tachypnea, accessory muscle usage or respiratory distress.     Breath sounds: Normal breath sounds. No stridor. No wheezing, rhonchi or rales.  Abdominal:     General: Bowel sounds are normal. There is no distension.     Palpations: Abdomen is soft.     Tenderness: There is no abdominal tenderness.  Musculoskeletal:        General: No tenderness.  Lymphadenopathy:     Cervical: No cervical adenopathy.  Skin:    General: Skin is warm and dry.     Capillary Refill: Capillary refill takes less than 2 seconds.     Findings: No rash.  Neurological:     Mental Status: She is alert and oriented to person, place, and time.  Psychiatric:        Behavior:  Behavior normal.      Vitals:   08/10/19 1524  BP: 126/84  Pulse: 64  Temp: 97.9 F (36.6 C)  TempSrc: Temporal  SpO2: 99%  Weight: 198 lb 6.4 oz (90 kg)  Height: 5\' 4"  (1.626 m)   99% on RA BMI Readings from Last 3 Encounters:  08/10/19 34.06 kg/m  12/20/18 32.96 kg/m  11/15/12 32.44 kg/m   Wt Readings from Last 3 Encounters:  08/10/19 198 lb 6.4 oz (90 kg)  12/20/18 192 lb (87.1 kg)  11/15/12 189 lb (85.7 kg)    CBC No results found for: WBC, RBC, HGB, HCT, PLT, MCV, MCH, MCHC, RDW, LYMPHSABS, MONOABS, EOSABS, BASOSABS  Results for KHAI, FOCKLER (MRN [id number]) as of 08/10/2019 07:10  12/20/2018 15:44  cu index 28.6 (H)  The CU Index(R) test is the second generation Functional  Anti-FceR test. Patients with a CU Index(R) greater than  or equal to 10 have basophil reactive factors in their  serum which supports an autoimmune basis for disease.    Chest Imaging: No previous chest imaging  Pulmonary Functions Testing Results: No flowsheet data found.  Asthma allergy of [place]: 12/20/2018: Office spirometry: FEV1 2.47 L, FVC 3.19 L, 90%, 88% respectively   FeNO: None  Pathology: Nonen  Echocardiogram: None   Heart Catheterization: None     Assessment & Plan:     ICD-10-CM   1. Allergic bronchial hyperresponsiveness  R06.2   2. Perennial and seasonal allergic rhinosinusitis  J30.89   3. 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 Former smoker  Z87.891     Discussion:  This is a 53 year old female that was originally referred for cough.  She had office spirometry that she was told was not necessarily normal.  We reviewed  this today in the office she has normal FEV1 normal percent predicted.  This slightly lower however asked expect 1 of the attempts on review was effort dependent and lowered her overall average numbers.  As for a respiratory standpoint she is no longer having this cough or any respiratory symptoms at  nighttime.  She is able to complete most of her activities of daily living.  She exercises regularly able to walk on a treadmill.  Her urticaria and atopic symptoms are under control.  She continues to use of her antihistamines.  I think she can continue the use of her albuterol for shortness of breath wheezing or any respiratory symptoms.  She could very well have mild intermittent asthma mainly due to her significant allergy history.  I think we can continue to observe this clinically and have albuterol available as needed.  We also discussed her risk for the development of lung cancer.  She is concerned about this as both her mother and father died from lung cancer.  She currently does not meet criteria for low-dose lung cancer screening CT imaging as she quit smoking many years ago and has a low pack-year history.  We discussed the various reasons for the development of lung cancer.  She is to let us know if her symptoms resurface.  If they do we will be happy to see her again.  She can return to the pulmonary clinic as needed.  As for the sleep symptoms I think she can establish care with 1 of our sleep physicians to discuss potential need or referral to home sleep study if deemed necessary.  Greater than 50% of this patient's 45-minute of visit was been face-to-face discussing above recommendations and treatment plan as well as review of the patient's office spirometry and symptomatology.   Current Outpatient Medications:  .  albuterol (VENTOLIN HFA) 108 (90 Base) MCG/ACT inhaler, INHALE 2 PUFFS INTO THE LUNGS EVERY 6 HOURS AS NEEDED FOR WHEEZING OR SHORTNESS OF BREATH, Disp: 6.7 g, Rfl: 0 .  Calcium-Phosphorus-Vitamin D (CALCIUM/VITAMIN D3/ADULT GUMMY PO), Take by mouth daily., Disp: , Rfl:  .  famotidine (PEPCID) 20 MG tablet, Take 1 tablet (20 mg total) by mouth 2 (two) times daily., Disp: 60 tablet, Rfl: 5 .  fluticasone (FLONASE) 50 MCG/ACT nasal spray, Place 2 sprays into both nostrils  daily., Disp: 16 g, Rfl: 5 .  levocetirizine (XYZAL) 5 MG tablet, TAKE 1 TABLET(5 MG) BY MOUTH EVERY EVENING, Disp: 30 tablet, Rfl: 0 .  Multiple Vitamin (MULTIVITAMIN) capsule, Take 1 capsule by mouth daily., Disp: , Rfl:  .  NEXIUM 40 MG capsule, , Disp: , Rfl:  .  NON FORMULARY, Citrucell Fiber Supplement, Disp: , Rfl:  .  olopatadine (PATANOL) 0.1 % ophthalmic solution, Place 1 drop into both eyes 2 (two) times daily. (Patient not taking: Reported on 08/10/2019), Disp: 5 mL, Rfl: 5 .  SUMAtriptan (IMITREX) 50 MG tablet, Take 50 mg by mouth every 2 (two) hours as needed for migraine., Disp: , Rfl:    Garner Nash, DO [facility] 08/10/2019 3:52 PM

## 2019-08-10 NOTE — Patient Instructions (Addendum)
Thank you for visiting Dr. Valeta Harms at [facility]. Today we recommend the following:  Continue use of your albuterol as needed for shortness of breath and cough.  If respiratory symptoms worsen of change please let us know.  We may need to consider full pulmonary function tests in the future.   Return if symptoms worsen or fail to improve.  Please set patient up with Drs. Sood or Olalere for next available sleep consultation.    Please do your part to reduce the spread of COVID-19.

## 2019-08-17 ENCOUNTER — Encounter: Payer: Self-pay | Admitting: Pulmonary Disease

## 2019-08-17 ENCOUNTER — Ambulatory Visit (INDEPENDENT_AMBULATORY_CARE_PROVIDER_SITE_OTHER): Payer: 59 | Admitting: Pulmonary Disease

## 2019-08-17 ENCOUNTER — Other Ambulatory Visit: Payer: Self-pay

## 2019-08-17 VITALS — BP 118/74 | HR 66 | Temp 98.4°F | Ht 64.0 in | Wt 191.6 lb

## 2019-08-17 DIAGNOSIS — R0683 Snoring: Secondary | ICD-10-CM

## 2019-08-17 NOTE — Progress Notes (Signed)
Ruth Roberts    [id number]    19-May-1966  Primary Care Physician:Barnes, Benjamine Mola, MD  Referring Physician: Leighton Ruff, [place],  [place] [address]  Chief complaint:   History of snoring, occasional gasping respirations at night  HPI:  Few years history of snoring No history of witnessed apneas Admits to morning headaches Dryness of the mouth in the mornings Wakes up with palpitations on occasions Has occasional teeth grinding  She uses breathing strips at night to try help the snoring and congestion  She feels her throat closes at night Admits to dryness and sore throat in the mornings  Usual bedtime about 1:59 AM Wakes up 3-8 times during the night Final awakening time about 10 AM  Weight is down about 15 to 20 pounds recently  Her brother and at dad have sleep apnea  Reformed smoker  Outpatient Encounter Medications as of 08/17/2019  Medication Sig  . albuterol (VENTOLIN HFA) 108 (90 Base) MCG/ACT inhaler INHALE 2 PUFFS INTO THE LUNGS EVERY 6 HOURS AS NEEDED FOR WHEEZING OR SHORTNESS OF BREATH  . Calcium-Phosphorus-Vitamin D (CALCIUM/VITAMIN D3/ADULT GUMMY PO) Take by mouth daily.  . famotidine (PEPCID) 20 MG tablet Take 1 tablet (20 mg total) by mouth 2 (two) times daily.  . fluticasone (FLONASE) 50 MCG/ACT nasal spray Place 2 sprays into both nostrils daily.  Marland Kitchen levocetirizine (XYZAL) 5 MG tablet TAKE 1 TABLET(5 MG) BY MOUTH EVERY EVENING  . Multiple Vitamin (MULTIVITAMIN) capsule Take 1 capsule by mouth daily.  Marland Kitchen NEXIUM 40 MG capsule   . NON FORMULARY Citrucell Fiber Supplement  . olopatadine (PATANOL) 0.1 % ophthalmic solution Place 1 drop into both eyes 2 (two) times daily.  . SUMAtriptan (IMITREX) 50 MG tablet Take 50 mg by mouth every 2 (two) hours as needed for migraine.   No facility-administered encounter medications on file as of 08/17/2019.     Allergies as of 08/17/2019  . (No Known Allergies)    Past Medical  History:  Diagnosis Date  . Sinus trouble     History reviewed. No pertinent surgical history.  Family History  Problem Relation Age of Onset  . Rheum arthritis Mother   . Scleroderma Mother   . Lung cancer Father   . Breast cancer Other   . Colon cancer Other   . Celiac disease Brother     Social History   Socioeconomic History  . Marital status: Single    Spouse name: Not on file  . Number of children: Not on file  . Years of education: Not on file  . Highest education level: Not on file  Occupational History  . Not on file  Social Needs  . Financial resource strain: Not on file  . Food insecurity    Worry: Not on file    Inability: Not on file  . Transportation needs    Medical: Not on file    Non-medical: Not on file  Tobacco Use  . Smoking status: Former Smoker    Packs/day: 0.25    Years: 25.00    Pack years: 6.25    Quit date: 2013    Years since quitting: 7.6  . Smokeless tobacco: Never Used  . Tobacco comment: social   Substance and Sexual Activity  . Alcohol use: Yes    Comment: rare  . Drug use: No  . Sexual activity: Not on file  Lifestyle  . Physical activity    Days per week:  Not on file    Minutes per session: Not on file  . Stress: Not on file  Relationships  . Social Herbalist on phone: Not on file    Gets together: Not on file    Attends religious service: Not on file    Active member of club or organization: Not on file    Attends meetings of clubs or organizations: Not on file    Relationship status: Not on file  . Intimate partner violence    Fear of current or ex partner: Not on file    Emotionally abused: Not on file    Physically abused: Not on file    Forced sexual activity: Not on file  Other Topics Concern  . Not on file  Social History Narrative   Patient is single and lives at home alone. Patient works at a [employer]. Patient has a high school education.    Review of Systems  Constitutional: Negative.    HENT: Negative.   Eyes: Negative.   Respiratory: Negative for cough and shortness of breath.   Gastrointestinal: Negative.   Psychiatric/Behavioral: Positive for sleep disturbance.    Vitals:   08/17/19 1139 08/17/19 1140  BP:  118/74  Pulse:  66  Temp: 98.4 F (36.9 C)   SpO2:  98%     Physical Exam  Constitutional: She appears well-developed and well-nourished.  HENT:  Head: Normocephalic and atraumatic.  Mallampati 3, crowded oropharynx  Eyes: Conjunctivae and EOM are normal. Right eye exhibits no discharge. Left eye exhibits no discharge.  Neck: Normal range of motion. Neck supple. No tracheal deviation present. No thyromegaly present.  Cardiovascular: Normal rate, regular rhythm and normal heart sounds.  Pulmonary/Chest: Effort normal and breath sounds normal. No respiratory distress. She has no wheezes.  Abdominal: Soft. Bowel sounds are normal. She exhibits no distension. There is no abdominal tenderness. There is no rebound.   Epworth of 10   Assessment:  Multiple recurrent significant sleep disordered breathing -Obesity, crowded oropharynx, daytime sleepiness, waking up with palpitations at night  Nonrestorative sleep  Excessive daytime sleepiness  Plan/Recommendations: Pathophysiology of sleep disordered breathing discussed with the patient  Treatment options discussed with the patient  We will schedule the patient for home sleep study  I will see her back in the office in about 2 to 3 months  Encouraged to call with any significant concerns   Sherrilyn Rist MD [facility] 08/17/2019, 12:02 PM  CC: Leighton Ruff, MD

## 2019-08-17 NOTE — Patient Instructions (Signed)
Moderate probability of significant sleep disordered breathing  We will schedule him for home sleep study  Options of treatment as discussed  I will see you back in the office in about 8 weeks  Call with significant concerns

## 2019-08-18 ENCOUNTER — Other Ambulatory Visit: Payer: Self-pay | Admitting: Allergy and Immunology

## 2019-08-30 ENCOUNTER — Ambulatory Visit: Payer: 59

## 2019-08-30 ENCOUNTER — Other Ambulatory Visit: Payer: Self-pay

## 2019-08-30 DIAGNOSIS — R0683 Snoring: Secondary | ICD-10-CM

## 2019-08-31 DIAGNOSIS — G4733 Obstructive sleep apnea (adult) (pediatric): Secondary | ICD-10-CM | POA: Diagnosis not present

## 2019-09-01 ENCOUNTER — Telehealth: Payer: Self-pay | Admitting: Pulmonary Disease

## 2019-09-01 DIAGNOSIS — G4733 Obstructive sleep apnea (adult) (pediatric): Secondary | ICD-10-CM | POA: Diagnosis not present

## 2019-09-01 NOTE — Telephone Encounter (Signed)
HST was performed on 08/31/2019. Dr. Ander Slade has reviewed the HST. Results showed mild OSA with significant daytime sleepiness. No significant oxygen desaturations. He recommends cpap therapy, auto cpap 5-15cm. Will order CPAP once patient is aware.

## 2019-09-16 NOTE — Telephone Encounter (Signed)
Pt calling for HST results.  [phone].

## 2019-09-16 NOTE — Telephone Encounter (Signed)
Called the patient back, advised of the results. The patient stated that she already set up a follow up appointment with Dr. Ander Slade and would like to discuss the study in more detail before ordering the CPAP machine.  Nothing further needed at this time.

## 2019-10-24 ENCOUNTER — Other Ambulatory Visit: Payer: Self-pay

## 2019-10-24 ENCOUNTER — Encounter: Payer: Self-pay | Admitting: Pulmonary Disease

## 2019-10-24 ENCOUNTER — Ambulatory Visit (INDEPENDENT_AMBULATORY_CARE_PROVIDER_SITE_OTHER): Payer: 59 | Admitting: Pulmonary Disease

## 2019-10-24 VITALS — BP 126/68 | HR 52 | Ht 64.0 in | Wt 199.4 lb

## 2019-10-24 DIAGNOSIS — G4733 Obstructive sleep apnea (adult) (pediatric): Secondary | ICD-10-CM

## 2019-10-24 NOTE — Progress Notes (Signed)
Ruth Roberts    [id number]    11-07-1966  Primary Care Physician:Barnes, Benjamine Mola, MD  Referring Physician: Leighton Ruff, [place],  [place] [address]  Chief complaint:   History of snoring, occasional gasping respirations at night  HPI:  She feels well today Has been exercising more Does at least 30 minutes of exercise on a regular basis  She has mild obstructive sleep apnea on a recent sleep study and CPAP was recommended She is in today to discuss options of treatment and follow-up  Her desire is to focus on her weight loss and exercise to see if this changes how she feels She denies significant daytime sleepiness at present She feels she functions well She feels she wakes up feeling like she has had a good night rest on most nights  Few years history of snoring No history of witnessed apneas Admits to morning headaches Dryness of the mouth in the mornings Wakes up with palpitations on occasions Has occasional teeth grinding  She uses breathing strips at night to try help the snoring and congestion  She feels her throat closes at night Admits to dryness and sore throat in the mornings  Usual bedtime about 1:59 AM Wakes up 3-8 times during the night Final awakening time about 10 AM  Weight is down about 15 to 20 pounds recently  Her brother and at dad have sleep apnea  Reformed smoker  Outpatient Encounter Medications as of 10/24/2019  Medication Sig  . albuterol (VENTOLIN HFA) 108 (90 Base) MCG/ACT inhaler INHALE 2 PUFFS INTO THE LUNGS EVERY 6 HOURS AS NEEDED FOR WHEEZING OR SHORTNESS OF BREATH  . Calcium-Phosphorus-Vitamin D (CALCIUM/VITAMIN D3/ADULT GUMMY PO) Take by mouth daily.  . famotidine (PEPCID) 20 MG tablet Take 1 tablet (20 mg total) by mouth 2 (two) times daily. (Patient taking differently: Take 20 mg by mouth 2 (two) times daily. As needed)  . fluticasone (FLONASE) 50 MCG/ACT nasal spray Place 2 sprays into both  nostrils daily.  Marland Kitchen levocetirizine (XYZAL) 5 MG tablet TAKE 1 TABLET(5 MG) BY MOUTH EVERY EVENING  . Multiple Vitamin (MULTIVITAMIN) capsule Take 1 capsule by mouth daily.  Marland Kitchen NEXIUM 40 MG capsule   . NON FORMULARY Citrucell Fiber Supplement  . [DISCONTINUED] olopatadine (PATANOL) 0.1 % ophthalmic solution Place 1 drop into both eyes 2 (two) times daily.  . [DISCONTINUED] SUMAtriptan (IMITREX) 50 MG tablet Take 50 mg by mouth every 2 (two) hours as needed for migraine.   No facility-administered encounter medications on file as of 10/24/2019.     Allergies as of 10/24/2019  . (No Known Allergies)    Past Medical History:  Diagnosis Date  . Sinus trouble     No past surgical history on file.  Family History  Problem Relation Age of Onset  . Rheum arthritis Mother   . Scleroderma Mother   . Lung cancer Father   . Breast cancer Other   . Colon cancer Other   . Celiac disease Brother     Social History   Socioeconomic History  . Marital status: Single    Spouse name: Not on file  . Number of children: Not on file  . Years of education: Not on file  . Highest education level: Not on file  Occupational History  . Not on file  Social Needs  . Financial resource strain: Not on file  . Food insecurity    Worry: Not on file  Inability: Not on file  . Transportation needs    Medical: Not on file    Non-medical: Not on file  Tobacco Use  . Smoking status: Former Smoker    Packs/day: 0.25    Years: 25.00    Pack years: 6.25    Quit date: 2013    Years since quitting: 7.8  . Smokeless tobacco: Never Used  . Tobacco comment: social   Substance and Sexual Activity  . Alcohol use: Yes    Comment: rare  . Drug use: No  . Sexual activity: Not on file  Lifestyle  . Physical activity    Days per week: Not on file    Minutes per session: Not on file  . Stress: Not on file  Relationships  . Social Herbalist on phone: Not on file    Gets together: Not on file     Attends religious service: Not on file    Active member of club or organization: Not on file    Attends meetings of clubs or organizations: Not on file    Relationship status: Not on file  . Intimate partner violence    Fear of current or ex partner: Not on file    Emotionally abused: Not on file    Physically abused: Not on file    Forced sexual activity: Not on file  Other Topics Concern  . Not on file  Social History Narrative   Patient is single and lives at home alone. Patient works at a [employer]. Patient has a high school education.    Review of Systems  Constitutional: Negative.   HENT: Negative.   Eyes: Negative.   Respiratory: Negative for cough and shortness of breath.   Gastrointestinal: Negative.   Psychiatric/Behavioral: Positive for sleep disturbance.    Vitals:   10/24/19 1344  BP: 126/68  Pulse: (!) 52  SpO2: 99%     Physical Exam  Constitutional: She appears well-developed and well-nourished.  HENT:  Head: Normocephalic and atraumatic.  Mallampati 3, crowded oropharynx  Eyes: Conjunctivae and EOM are normal. Right eye exhibits no discharge. Left eye exhibits no discharge.  Neck: Normal range of motion. Neck supple. No tracheal deviation present. No thyromegaly present.  Cardiovascular: Normal rate, regular rhythm and normal heart sounds.  Pulmonary/Chest: Effort normal and breath sounds normal. No respiratory distress. She has no wheezes.  Abdominal: Soft. Bowel sounds are normal. She exhibits no distension. There is no abdominal tenderness. There is no rebound.   Epworth of 10 last visit  Results of the Epworth flowsheet 10/24/2019  Sitting and reading 1  Watching TV 1  Sitting, inactive in a public place (e.g. a theatre or a meeting) 0  As a passenger in a car for an hour without a break 0  Lying down to rest in the afternoon when circumstances permit 2  Sitting and talking to someone 0  Sitting quietly after a lunch without alcohol 1  In a  car, while stopped for a few minutes in traffic 0  Total score 5     Assessment:  Multiple recurrent significant sleep disordered breathing -Obesity, crowded oropharynx, daytime sleepiness, waking up with palpitations at night  Nonrestorative sleep  Mild obstructive sleep apnea  Daytime sleepiness seems to have improved  Plan/Recommendations: Pathophysiology of sleep disordered breathing discussed with the patient  Treatment options discussed with the patient  Patient desires to wait and focus on weight loss and exercise She is is not significantly sleepy  during the day at present  Risk appears low  I will see her back in the office in a year  Encouraged to call if any significant worsening of symptoms   Sherrilyn Rist MD [facility] 10/24/2019, 1:50 PM  CC: Leighton Ruff, MD

## 2019-10-24 NOTE — Patient Instructions (Signed)
Mild obstructive sleep apnea  Continue with focusing on exercising and weight loss efforts  Call with any significant concerns  We will reevaluate in a year

## 2019-11-07 ENCOUNTER — Other Ambulatory Visit: Payer: Self-pay | Admitting: Allergy and Immunology

## 2019-11-15 ENCOUNTER — Telehealth: Payer: Self-pay | Admitting: Pulmonary Disease

## 2019-11-15 DIAGNOSIS — G4733 Obstructive sleep apnea (adult) (pediatric): Secondary | ICD-10-CM

## 2019-11-15 NOTE — Telephone Encounter (Signed)
Dr. Ander Slade please advise on this. I see on your last visit that she has mild OSA and to return in a year. She is now requesting to start CPAP. What are your recommendations.

## 2019-11-22 NOTE — Telephone Encounter (Signed)
Dr. Olalere, please advise. Thanks 

## 2019-11-22 NOTE — Telephone Encounter (Signed)
Spoke with pt, aware of recs.  Order placed for cpap. No feb or march schedule laid yet for AO, so reminder placed in chart.  Nothing further needed at this time- will close encounter.

## 2019-11-22 NOTE — Telephone Encounter (Signed)
Auto CPAP settings of 5-15

## 2019-12-06 ENCOUNTER — Other Ambulatory Visit: Payer: Self-pay | Admitting: Allergy and Immunology

## 2020-03-10 ENCOUNTER — Ambulatory Visit: Payer: Self-pay | Attending: Internal Medicine

## 2020-03-10 DIAGNOSIS — Z23 Encounter for immunization: Secondary | ICD-10-CM

## 2020-03-10 NOTE — Progress Notes (Signed)
   Covid-19 Vaccination Clinic  Name:  Ruth Roberts    MRN: [id number] DOB: 1966-07-09  03/10/2020  [name] was observed post Covid-19 immunization for 15 minutes without incident. She was provided with Vaccine Information Sheet and instruction to access the V-Safe system.   [name] was instructed to call 911 with any severe reactions post vaccine: Marland Kitchen Difficulty breathing  . Swelling of face and throat  . A fast heartbeat  . A bad rash all over body  . Dizziness and weakness   Immunizations Administered    Name Date Dose VIS Date Route   Pfizer COVID-19 Vaccine 03/10/2020  3:39 PM 0.3 mL 11/25/2019 Intramuscular   Manufacturer: [place]   Lot: [id number]   [place]: [id number]      Covid-19 Vaccination Clinic  Name:  Ruth Roberts    MRN: [id number] DOB: [birth year]  03/10/2020  [name] was observed post Covid-19 immunization for 15 minutes without incident. She was provided with Vaccine Information Sheet and instruction to access the V-Safe system.   [name] was instructed to call 911 with any severe reactions post vaccine: Marland Kitchen Difficulty breathing  . Swelling of face and throat  . A fast heartbeat  . A bad rash all over body  . Dizziness and weakness   Immunizations Administered    Name Date Dose VIS Date Route   Pfizer COVID-19 Vaccine 03/10/2020  3:39 PM 0.3 mL 11/25/2019 Intramuscular   Manufacturer: [place]   Lot: [id number]   [place]: [id number]

## 2020-04-03 ENCOUNTER — Other Ambulatory Visit: Payer: Self-pay | Admitting: Family Medicine

## 2020-04-03 ENCOUNTER — Ambulatory Visit: Payer: Self-pay | Attending: Internal Medicine

## 2020-04-03 DIAGNOSIS — Z23 Encounter for immunization: Secondary | ICD-10-CM

## 2020-04-03 DIAGNOSIS — Z1231 Encounter for screening mammogram for malignant neoplasm of breast: Secondary | ICD-10-CM

## 2020-04-03 NOTE — Progress Notes (Signed)
   Covid-19 Vaccination Clinic  Name:  Devera Moy    MRN: [id number] DOB: 1966-12-08  04/03/2020  Ms. Peloquin was observed post Covid-19 immunization for 15 minutes without incident. She was provided with Vaccine Information Sheet and instruction to access the V-Safe system.   Ms. Choplin was instructed to call 911 with any severe reactions post vaccine: Marland Kitchen Difficulty breathing  . Swelling of face and throat  . A fast heartbeat  . A bad rash all over body  . Dizziness and weakness   Immunizations Administered    Name Date Dose VIS Date Route   Pfizer COVID-19 Vaccine 04/03/2020 11:36 AM 0.3 mL 02/08/2019 Intramuscular   Manufacturer: [place]   Lot: [id number]   [place]: [id number]

## 2020-05-15 ENCOUNTER — Ambulatory Visit
Admission: RE | Admit: 2020-05-15 | Discharge: 2020-05-15 | Disposition: A | Discharge status: Home / Self Care | Payer: 59 | Source: Ambulatory Visit | Attending: Family Medicine | Admitting: Family Medicine

## 2020-05-15 ENCOUNTER — Other Ambulatory Visit: Payer: Self-pay

## 2020-05-15 DIAGNOSIS — Z1231 Encounter for screening mammogram for malignant neoplasm of breast: Secondary | ICD-10-CM

## 2020-11-01 ENCOUNTER — Ambulatory Visit: Payer: 59 | Attending: Internal Medicine

## 2020-11-01 DIAGNOSIS — Z23 Encounter for immunization: Secondary | ICD-10-CM

## 2020-11-01 NOTE — Progress Notes (Signed)
   Covid-19 Vaccination Clinic  Name:  Ruth Roberts    MRN: [id number] DOB: [birth year]  11/01/2020  [name] was observed post Covid-19 immunization for 15 minutes without incident. She was provided with Vaccine Information Sheet and instruction to access the V-Safe system.   [name] was instructed to call 911 with any severe reactions post vaccine: Marland Kitchen Difficulty breathing  . Swelling of face and throat  . A fast heartbeat  . A bad rash all over body  . Dizziness and weakness   Immunizations Administered    Name Date Dose VIS Date Route   Pfizer COVID-19 Vaccine 11/01/2020  2:02 PM 0.3 mL 10/03/2020 Intramuscular   Manufacturer: [place]   Lot: [id number]   [place]: [id number]

## 2021-04-11 ENCOUNTER — Ambulatory Visit: Payer: 59 | Attending: Internal Medicine

## 2021-04-11 ENCOUNTER — Other Ambulatory Visit: Payer: Self-pay

## 2021-04-11 DIAGNOSIS — Z20822 Contact with and (suspected) exposure to covid-19: Secondary | ICD-10-CM

## 2021-04-12 LAB — NOVEL CORONAVIRUS, NAA: SARS-CoV-2, NAA: NOT DETECTED

## 2021-04-12 LAB — SARS-COV-2, NAA 2 DAY TAT

## 2021-06-14 ENCOUNTER — Other Ambulatory Visit: Payer: Self-pay | Admitting: Family Medicine

## 2021-06-14 DIAGNOSIS — Z1231 Encounter for screening mammogram for malignant neoplasm of breast: Secondary | ICD-10-CM

## 2021-06-18 ENCOUNTER — Ambulatory Visit: Payer: 59

## 2021-06-20 ENCOUNTER — Ambulatory Visit: Payer: 59

## 2021-06-27 ENCOUNTER — Ambulatory Visit
Admission: RE | Admit: 2021-06-27 | Discharge: 2021-06-27 | Disposition: A | Discharge status: Home / Self Care | Payer: 59 | Source: Ambulatory Visit | Attending: Family Medicine | Admitting: Family Medicine

## 2021-06-27 ENCOUNTER — Other Ambulatory Visit: Payer: Self-pay

## 2021-06-27 DIAGNOSIS — Z1231 Encounter for screening mammogram for malignant neoplasm of breast: Secondary | ICD-10-CM

## 2021-07-12 ENCOUNTER — Telehealth: Payer: Self-pay | Admitting: *Deleted

## 2021-07-12 ENCOUNTER — Other Ambulatory Visit: Payer: Self-pay | Admitting: Gastroenterology

## 2021-07-12 DIAGNOSIS — K219 Gastro-esophageal reflux disease without esophagitis: Secondary | ICD-10-CM

## 2021-07-12 NOTE — Telephone Encounter (Signed)
Called [employer] and spoke with Wells Guiles and was told they did not have any data transmitted since 2021.  Advised that her CPAP should have an SD card.  I let her know I would call the patient and have her bring the card in on Monday.  Called and spoke with patient, she states she is not wearing the CPAP machine, she only wore it for about a month and then turned it back in.  Advised I would note that and see her on Monday.  Nothing further needed.

## 2021-07-15 ENCOUNTER — Other Ambulatory Visit: Payer: Self-pay

## 2021-07-15 ENCOUNTER — Ambulatory Visit (INDEPENDENT_AMBULATORY_CARE_PROVIDER_SITE_OTHER): Payer: 59 | Admitting: Adult Health

## 2021-07-15 ENCOUNTER — Encounter: Payer: Self-pay | Admitting: Adult Health

## 2021-07-15 ENCOUNTER — Ambulatory Visit (INDEPENDENT_AMBULATORY_CARE_PROVIDER_SITE_OTHER): Payer: 59

## 2021-07-15 VITALS — BP 116/78 | HR 69 | Temp 97.7°F | Ht 64.0 in | Wt 196.0 lb

## 2021-07-15 DIAGNOSIS — K219 Gastro-esophageal reflux disease without esophagitis: Secondary | ICD-10-CM | POA: Diagnosis not present

## 2021-07-15 DIAGNOSIS — R062 Wheezing: Secondary | ICD-10-CM

## 2021-07-15 DIAGNOSIS — G4733 Obstructive sleep apnea (adult) (pediatric): Secondary | ICD-10-CM | POA: Insufficient documentation

## 2021-07-15 DIAGNOSIS — J3089 Other allergic rhinitis: Secondary | ICD-10-CM | POA: Diagnosis not present

## 2021-07-15 NOTE — Assessment & Plan Note (Signed)
Mild obstructive sleep apnea with ongoing symptoms.  Patient has tried CPAP and was intolerant previously.  Will refer to orthodontist for evaluation of oral appliance.  Patient has had invisible line and has nocturnal retainer that she wears at night. If unable to wear oral appliance . May need repeat Home sleep study , and consider rechallenge with CPAP .   Plan  Patient Instructions  Begin Allegra D daily As needed nasal congestion  Saline nasal rinses Twice daily  .  Saline nasal gel At bedtime   Refer to Dr. Ron Parker for oral appliance for sleep apnea.  Continue on Protonix daily  Continue on Pepcid '20mg'$  At bedtime   GERD diet.  Chest xray today .  Follow up with Dr. Ander Slade or Nolan Lasser NP in 3 months with PFT.  Please contact office for sooner follow up if symptoms do not improve or worsen or seek emergency care

## 2021-07-15 NOTE — Assessment & Plan Note (Signed)
Chronic rhinitis.  Continue with saline nasal rinses.  Add in saline nasal gel at nighttime.  Begin Nasonex. Begin Allegra-D as needed  Plan  Patient Instructions  Begin Allegra D daily As needed nasal congestion  Saline nasal rinses Twice daily  .  Saline nasal gel At bedtime   Refer to Dr. Ron Parker for oral appliance for sleep apnea.  Continue on Protonix daily  Continue on Pepcid '20mg'$  At bedtime   GERD diet.  Chest xray today .  Follow up with Dr. Ander Slade or Aerica Rincon NP in 3 months with PFT.  Please contact office for sooner follow up if symptoms do not improve or worsen or seek emergency care

## 2021-07-15 NOTE — Assessment & Plan Note (Signed)
Continue on GERD diet  PPI and Pepcid  Plan  Patient Instructions  Begin Allegra D daily As needed nasal congestion  Saline nasal rinses Twice daily  .  Saline nasal gel At bedtime   Refer to Dr. Ron Parker for oral appliance for sleep apnea.  Continue on Protonix daily  Continue on Pepcid '20mg'$  At bedtime   GERD diet.  Chest xray today .  Follow up with Dr. Ander Slade or Prentis Langdon NP in 3 months with PFT.  Please contact office for sooner follow up if symptoms do not improve or worsen or seek emergency care

## 2021-07-15 NOTE — Assessment & Plan Note (Signed)
Post Covid reactive airways   Check PFT on return  Check Chest xray today  Trigger control  Albuterol inhaler As needed    Plan  Patient Instructions  Begin Allegra D daily As needed nasal congestion  Saline nasal rinses Twice daily  .  Saline nasal gel At bedtime   Refer to Dr. Ron Parker for oral appliance for sleep apnea.  Continue on Protonix daily  Continue on Pepcid '20mg'$  At bedtime   GERD diet.  Chest xray today .  Follow up with Dr. Ander Slade or Atalia Litzinger NP in 3 months with PFT.  Please contact office for sooner follow up if symptoms do not improve or worsen or seek emergency care

## 2021-07-15 NOTE — Progress Notes (Signed)
$'@Patient'Q$  ID: Ruth Roberts, female    DOB: 06-15-1966, 55 y.o.   MRN: [id number]  Chief Complaint  Patient presents with   Follow-up    Referring provider: Leighton Ruff, MD  HPI: 55 year old female former smoker followed for obstructive sleep apnea Patient is Chief Executive Officer.   TEST/EVENTS :  HST 08/31/19- Mild OSA    07/15/2021 Follow up : OSA  Patient returns for a follow-up of sleep apnea.  Patient was last seen November 2020.  Patient underwent a home sleep study September 2020 that showed mild obstructive sleep apnea.  She was recommended for CPAP.  Patient says she only wore her CPAP machine for about a month did not feel like she had any benefit from it and was unable to tolerate. Caused significant nasal congestion. Also insurance changed and was not covering CPAP cost.   She returned her equipment to the local homecare company. Complains that she is having more trouble sleeping and nasal congestion.  Feels she does not sleep well. Relates a lot of her sleep issues to chronic sinus and nasal issues.  Takes Sudafed and Mucinex D daily for chronic nasal stuffiness.  Wears breath right strip at night . Wakes her self up snoring .  Has seen Allergy and ENT with chronic rhinitis  in past. Continues to have daily symptoms .  Had Covid Mid June 2022  , took [employer]. Had lingering URI symptoms with headache and sore throat for couple of weeks . Symptoms have improved but has lingering chronic nasal congestion . Former smoker. No hemoptysis , chest pain or orthopnea.   Had invisalign from [employer], 2 years ago , wears nocturnal retainers .   FH strong for autoimmune.  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    No Known Allergies  Immunization History  Administered Date(s) Administered   Influenza,inj,Quad PF,6+ Mos 10/27/2018, 09/25/2019   Influenza-Unspecified 09/13/2018, 09/23/2019   PFIZER(Purple Top)SARS-COV-2 Vaccination 03/10/2020, 04/03/2020, 11/01/2020,  03/19/2021    Past Medical History:  Diagnosis Date   Sinus trouble     Tobacco History: Social History   Tobacco Use  Smoking Status Former   Packs/day: 0.25   Years: 25.00   Pack years: 6.25   Types: Cigarettes   Quit date: 2013   Years since quitting: 9.5  Smokeless Tobacco Never  Tobacco Comments   social    Counseling given: Not Answered Tobacco comments: social    Outpatient Medications Prior to Visit  Medication Sig Dispense Refill   albuterol (VENTOLIN HFA) 108 (90 Base) MCG/ACT inhaler INHALE 2 PUFFS INTO THE LUNGS EVERY 6 HOURS AS NEEDED FOR WHEEZING OR SHORTNESS OF BREATH 6.7 g 0   Calcium-Phosphorus-Vitamin D (CALCIUM/VITAMIN D3/ADULT GUMMY PO) Take by mouth daily.     fluticasone (FLONASE) 50 MCG/ACT nasal spray Place 2 sprays into both nostrils daily. 16 g 5   LAGEVRIO 200 MG CAPS SMARTSIG:4 Capsule(s) By Mouth Every 12 Hours     levocetirizine (XYZAL) 5 MG tablet TAKE 1 TABLET(5 MG) BY MOUTH EVERY EVENING 30 tablet 2   Multiple Vitamin (MULTIVITAMIN) capsule Take 1 capsule by mouth daily.     NON FORMULARY Citrucell Fiber Supplement     pantoprazole (PROTONIX) 40 MG tablet Take 40 mg by mouth every morning.     famotidine (PEPCID) 20 MG tablet Take 1 tablet (20 mg total) by mouth 2 (two) times daily. (Patient not taking: Reported on 07/15/2021) 60 tablet 5   NEXIUM 40 MG capsule  No facility-administered medications prior to visit.     Review of Systems:   Constitutional:   No  weight loss, night sweats,  Fevers, chills, fatigue, or  lassitude.  HEENT:   No headaches,  Difficulty swallowing,  Tooth/dental problems, or  Sore throat,                No sneezing, itching, ear ache,  +nasal congestion, post nasal drip,   CV:  No chest pain,  Orthopnea, PND, swelling in lower extremities, anasarca, dizziness, palpitations, syncope.   GI  No heartburn, indigestion, abdominal pain, nausea, vomiting, diarrhea, change in bowel habits, loss of appetite,  bloody stools.   Resp: .  No chest wall deformity  Skin: no rash or lesions.  GU: no dysuria, change in color of urine, no urgency or frequency.  No flank pain, no hematuria   MS:  No joint pain or swelling.  No decreased range of motion.  No back pain.    Physical Exam  BP 116/78 (BP Location: Left Arm, Patient Position: Sitting, Cuff Size: Large)   Pulse 69   Temp 97.7 F (36.5 C) (Oral)   Ht '5\' 4"'$  (1.626 m)   Wt 196 lb (88.9 kg)   SpO2 98%   BMI 33.64 kg/m   GEN: A/Ox3; pleasant , NAD, well nourished    HEENT:  [place]/AT,  EACs-+cerumen , TMs-wnl, NOSE-clear drainage , THROAT-clear, no lesions, no postnasal drip or exudate noted.   NECK:  Supple w/ fair ROM; no JVD; normal carotid impulses w/o bruits; no thyromegaly or nodules palpated; no lymphadenopathy.    RESP  Clear  P & A; w/o, wheezes/ rales/ or rhonchi. no accessory muscle use, no dullness to percussion  CARD:  RRR, no m/r/g, no peripheral edema, pulses intact, no cyanosis or clubbing.  GI:   Soft & nt; nml bowel sounds; no organomegaly or masses detected.   Musco: Warm bil, no deformities or joint swelling noted.   Neuro: alert, no focal deficits noted.    Skin: Warm, no lesions or rashes    Lab Results:  CBC No results found for: WBC, RBC, HGB, HCT, PLT, MCV, MCH, MCHC, RDW, LYMPHSABS, MONOABS, EOSABS, BASOSABS  BMET   BNP No results found for: BNP  ProBNP No results found for: PROBNP  Imaging:  No flowsheet data found.  No results found for: NITRICOXIDE      Assessment & Plan:   OSA (obstructive sleep apnea) Mild obstructive sleep apnea with ongoing symptoms.  Patient has tried CPAP and was intolerant previously.  Will refer to orthodontist for evaluation of oral appliance.  Patient has had invisible line and has nocturnal retainer that she wears at night. If unable to wear oral appliance . May need repeat Home sleep study , and consider rechallenge with CPAP .   Plan  Patient  Instructions  Begin Allegra D daily As needed nasal congestion  Saline nasal rinses Twice daily  .  Saline nasal gel At bedtime   Refer to Dr. Ron Parker for oral appliance for sleep apnea.  Continue on Protonix daily  Continue on Pepcid '20mg'$  At bedtime   GERD diet.  Chest xray today .  Follow up with Dr. Ander Slade or Neelie Welshans NP in 3 months with PFT.  Please contact office for sooner follow up if symptoms do not improve or worsen or seek emergency care          Perennial and seasonal allergic rhinosinusitis Chronic rhinitis.  Continue with saline nasal rinses.  Add in saline nasal gel at nighttime.  Begin Nasonex. Begin Allegra-D as needed  Plan  Patient Instructions  Begin Allegra D daily As needed nasal congestion  Saline nasal rinses Twice daily  .  Saline nasal gel At bedtime   Refer to Dr. Ron Parker for oral appliance for sleep apnea.  Continue on Protonix daily  Continue on Pepcid '20mg'$  At bedtime   GERD diet.  Chest xray today .  Follow up with Dr. Ander Slade or Daena Alper NP in 3 months with PFT.  Please contact office for sooner follow up if symptoms do not improve or worsen or seek emergency care          GERD (gastroesophageal reflux disease) Continue on GERD diet  PPI and Pepcid  Plan  Patient Instructions  Begin Allegra D daily As needed nasal congestion  Saline nasal rinses Twice daily  .  Saline nasal gel At bedtime   Refer to Dr. Ron Parker for oral appliance for sleep apnea.  Continue on Protonix daily  Continue on Pepcid '20mg'$  At bedtime   GERD diet.  Chest xray today .  Follow up with Dr. Ander Slade or Rachella Basden NP in 3 months with PFT.  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  Saline nasal gel At bedtime   Refer to Dr. Ron Parker for oral appliance for sleep apnea.  Continue on Protonix daily  Continue on Pepcid '20mg'$  At bedtime   GERD diet.  Chest xray today .  Follow up with Dr. Ander Slade or Atalya Dano NP in 3 months with PFT.  Please contact office for sooner follow up if symptoms do not improve or worsen or seek emergency care         Rexene Edison, NP 07/15/2021

## 2021-07-15 NOTE — Patient Instructions (Addendum)
Begin Allegra D daily As needed nasal congestion  Saline nasal rinses Twice daily  .  Saline nasal gel At bedtime   Refer to Dr. Ron Parker for oral appliance for sleep apnea.  Continue on Protonix daily  Continue on Pepcid '20mg'$  At bedtime   GERD diet.  Chest xray today .  Follow up with Dr. Ander Slade or Shaylynne Lunt NP in 3 months with PFT.  Please contact office for sooner follow up if symptoms do not improve or worsen or seek emergency care

## 2021-07-16 ENCOUNTER — Ambulatory Visit
Admission: RE | Admit: 2021-07-16 | Discharge: 2021-07-16 | Disposition: A | Discharge status: Home / Self Care | Payer: 59 | Source: Ambulatory Visit | Attending: Gastroenterology | Admitting: Gastroenterology

## 2021-07-16 ENCOUNTER — Other Ambulatory Visit: Payer: Self-pay | Admitting: Gastroenterology

## 2021-07-16 DIAGNOSIS — K219 Gastro-esophageal reflux disease without esophagitis: Secondary | ICD-10-CM

## 2021-07-22 ENCOUNTER — Other Ambulatory Visit: Payer: Self-pay | Admitting: Gastroenterology

## 2021-07-22 DIAGNOSIS — R1011 Right upper quadrant pain: Secondary | ICD-10-CM

## 2021-07-29 ENCOUNTER — Encounter (HOSPITAL_BASED_OUTPATIENT_CLINIC_OR_DEPARTMENT_OTHER): Payer: Self-pay | Admitting: *Deleted

## 2021-07-29 ENCOUNTER — Other Ambulatory Visit: Payer: Self-pay

## 2021-07-29 DIAGNOSIS — Z8616 Personal history of COVID-19: Secondary | ICD-10-CM | POA: Insufficient documentation

## 2021-07-29 DIAGNOSIS — R519 Headache, unspecified: Secondary | ICD-10-CM | POA: Diagnosis present

## 2021-07-29 DIAGNOSIS — Z87891 Personal history of nicotine dependence: Secondary | ICD-10-CM | POA: Diagnosis not present

## 2021-07-29 DIAGNOSIS — R002 Palpitations: Secondary | ICD-10-CM | POA: Diagnosis not present

## 2021-07-29 DIAGNOSIS — R0789 Other chest pain: Secondary | ICD-10-CM | POA: Insufficient documentation

## 2021-07-29 NOTE — ED Triage Notes (Signed)
8 weeks ago Covid. Since she has been treated for erratic HR, seen PCP and sleep study, this weekend headache developed. Also states she has been having HTN

## 2021-07-30 ENCOUNTER — Emergency Department (HOSPITAL_BASED_OUTPATIENT_CLINIC_OR_DEPARTMENT_OTHER): Payer: 59

## 2021-07-30 ENCOUNTER — Emergency Department (HOSPITAL_BASED_OUTPATIENT_CLINIC_OR_DEPARTMENT_OTHER)
Admission: EM | Admit: 2021-07-30 | Discharge: 2021-07-30 | Disposition: A | Discharge status: Home / Self Care | Payer: 59 | Attending: Emergency Medicine | Admitting: Emergency Medicine

## 2021-07-30 DIAGNOSIS — R002 Palpitations: Secondary | ICD-10-CM

## 2021-07-30 DIAGNOSIS — R079 Chest pain, unspecified: Secondary | ICD-10-CM

## 2021-07-30 DIAGNOSIS — R519 Headache, unspecified: Secondary | ICD-10-CM

## 2021-07-30 LAB — COMPREHENSIVE METABOLIC PANEL
ALT: 10 U/L (ref 0–44)
AST: 12 U/L — ABNORMAL LOW (ref 15–41)
Albumin: 4.2 g/dL (ref 3.5–5.0)
Alkaline Phosphatase: 35 U/L — ABNORMAL LOW (ref 38–126)
Anion gap: 7 (ref 5–15)
BUN: 7 mg/dL (ref 6–20)
CO2: 28 mmol/L (ref 22–32)
Calcium: 9.2 mg/dL (ref 8.9–10.3)
Chloride: 104 mmol/L (ref 98–111)
Creatinine, Ser: 0.56 mg/dL (ref 0.44–1.00)
GFR, Estimated: >60 mL/min (ref >60)
Glucose, Bld: 120 mg/dL — ABNORMAL HIGH (ref 70–99)
Potassium: 3.6 mmol/L (ref 3.5–5.1)
Sodium: 139 mmol/L (ref 135–145)
Total Bilirubin: 0.3 mg/dL (ref 0.3–1.2)
Total Protein: 6.6 g/dL (ref 6.5–8.1)

## 2021-07-30 LAB — CBC
HCT: 38.9 % (ref 36.0–46.0)
Hemoglobin: 13.1 g/dL (ref 12.0–15.0)
MCH: 31.3 pg (ref 26.0–34.0)
MCHC: 33.7 g/dL (ref 30.0–36.0)
MCV: 93.1 fL (ref 80.0–100.0)
Platelets: 274 10*3/uL (ref 150–400)
RBC: 4.18 MIL/uL (ref 3.87–5.11)
RDW: 12.2 % (ref 11.5–15.5)
WBC: 6.3 10*3/uL (ref 4.0–10.5)
nRBC: 0 % (ref 0.0–0.2)

## 2021-07-30 LAB — TROPONIN I (HIGH SENSITIVITY): Troponin I (High Sensitivity): <2 ng/L (ref <18)

## 2021-07-30 LAB — MAGNESIUM: Magnesium: 1.9 mg/dL (ref 1.7–2.4)

## 2021-07-30 NOTE — ED Provider Notes (Signed)
[facility] Emergency Department Provider Note MRN:  [id number]  Arrival date & time: 07/30/21     Chief Complaint   Palpitations, Hypertension, and Headache   History of Present Illness   Ruth Roberts is a 55 y.o. year-old female with no pertinent past medical history presenting to the ED with chief complaint of headache.  Severe headache over the weekend, gradual onset, frontal, not responding to NSAIDs at home.  Has been having some worsening headaches and palpitations ever since contracting COVID a weeks ago.  Also having some chest tightness intermittently.  Denies any shortness of breath, no leg pain or swelling, no rash, no abdominal pain, no nausea vomiting.  Has noted some high blood pressure at home that she is worried about, 140s over 90s.  Review of Systems  A complete 10 system review of systems was obtained and all systems are negative except as noted in the HPI and PMH.   Patient's Health History    Past Medical History:  Diagnosis Date   Sinus trouble     History reviewed. No pertinent surgical history.  Family History  Problem Relation Age of Onset   Rheum arthritis Mother    Scleroderma Mother    Lung cancer Father    Breast cancer Other    Colon cancer Other    Celiac disease Brother     Social History   Socioeconomic History   Marital status: Single    Spouse name: Not on file   Number of children: Not on file   Years of education: Not on file   Highest education level: Not on file  Occupational History   Not on file  Tobacco Use   Smoking status: Former    Packs/day: 0.25    Years: 25.00    Pack years: 6.25    Types: Cigarettes    Quit date: 2013    Years since quitting: 9.6   Smokeless tobacco: Never   Tobacco comments:    social   Scientific laboratory technician Use: Never used  Substance and Sexual Activity   Alcohol use: Yes    Comment: rare   Drug use: No   Sexual activity: Not on file  Other Topics Concern    Not on file  Social History Narrative   Patient is single and lives at home alone. Patient works at a [employer]. Patient has a high school education.   Social Determinants of Health   Financial Resource Strain: Not on file  Food Insecurity: Not on file  Transportation Needs: Not on file  Physical Activity: Not on file  Stress: Not on file  Social Connections: Not on file  Intimate Partner Violence: Not on file     Physical Exam   Vitals:   07/30/21 0122 07/30/21 0200  BP: (!) 139/93 135/87  Pulse: (!) 52 (!) 55  Resp: 20 17  Temp:    SpO2: 100% 99%    CONSTITUTIONAL: Well-appearing, NAD NEURO:  Alert and oriented x 3, no focal deficits EYES:  eyes equal and reactive ENT/NECK:  no LAD, no JVD CARDIO: Regular rate, well-perfused, normal S1 and S2 PULM:  CTAB no wheezing or rhonchi GI/GU:  normal bowel sounds, non-distended, non-tender MSK/SPINE:  No gross deformities, no edema SKIN:  no rash, atraumatic PSYCH:  Appropriate speech and behavior  *Additional and/or pertinent findings included in MDM below  Diagnostic and Interventional Summary    EKG Interpretation  Date/Time:  Monday July 29 2021 18:40:16 EDT  Ventricular Rate:  74 PR Interval:  162 QRS Duration: 86 QT Interval:  356 QTC Calculation: 395 R Axis:   85 Text Interpretation: Sinus rhythm with marked sinus arrhythmia Otherwise normal ECG Confirmed by Gerlene Fee [phone]) on 07/30/2021 12:41:21 AM       Labs Reviewed  COMPREHENSIVE METABOLIC PANEL - Abnormal; Notable for the following components:      Result Value   Glucose, Bld 120 (*)    AST 12 (*)    Alkaline Phosphatase 35 (*)    All other components within normal limits  CBC  MAGNESIUM  TROPONIN I (HIGH SENSITIVITY)    CT HEAD WO CONTRAST (5MM)  Final Result      Medications - No data to display   Procedures  /  Critical Care Procedures  ED Course and Medical Decision Making  I have reviewed the triage vital signs, the nursing  notes, and pertinent available records from the EMR.  Listed above are laboratory and imaging tests that I personally ordered, reviewed, and interpreted and then considered in my medical decision making (see below for details).  Headaches, palpitations, chest tightness, worried about her high blood pressure.  Overall she is well-appearing with reassuring vital signs, doubt emergent process.  Given the new features to her headaches, will obtain screening CT head, given the chest pain we will obtain EKG and troponin.  Suspect long [place].     Work-up reassuring, appropriate for discharge.  Barth Kirks. Sedonia Small, MD [place] [email or web address]  Final Clinical Impressions(s) / ED Diagnoses     ICD-10-CM   1. Acute nonintractable headache, unspecified headache type  R51.9     2. Chest pain, unspecified type  R07.9     3. Palpitations  R00.2       ED Discharge Orders     None        Discharge Instructions Discussed with and Provided to Patient:     Discharge Instructions      You were evaluated in the Emergency Department and after careful evaluation, we did not find any emergent condition requiring admission or further testing in the hospital.  Your exam/testing today was overall reassuring.  Symptoms may be related to lingering COVID symptoms.  Recommend follow-up with primary care doctor for further management.  Please return to the Emergency Department if you experience any worsening of your condition.  Thank you for allowing [place] to be a part of your care.         Maudie Flakes, MD 07/30/21 [phone]

## 2021-07-30 NOTE — Discharge Instructions (Addendum)
You were evaluated in the Emergency Department and after careful evaluation, we did not find any emergent condition requiring admission or further testing in the hospital.  Your exam/testing today was overall reassuring.  Symptoms may be related to lingering COVID symptoms.  Recommend follow-up with primary care doctor for further management.  Please return to the Emergency Department if you experience any worsening of your condition.  Thank you for allowing [place] to be a part of your care.

## 2021-08-14 NOTE — Progress Notes (Signed)
Date:  08/15/2021   ID:  Eloise Levels, DOB 10/15/1966, MRN [id number]  PCP:  Faustino Congress, NP  Cardiologist:  Rex Kras, DO, [facility]  (established care 08/15/2021)  REASON FOR CONSULT: Palpitations  REQUESTING PHYSICIAN:  Leighton Ruff, [place],   [id number]  Chief Complaint  Patient presents with   Palpitations   New Patient (Initial Visit)    HPI  Ruth Roberts is a 55 y.o. female who presents to the office with a chief complaint of "palpitations." Patient's past medical history and cardiovascular risk factors include: GERD, anxiety, obesity due to excess calories, hypertension (after COVID infection), former smoker, mild sleep apnea, Hx of COVID 19 infection.   She is referred to the office at the request of Leighton Ruff, MD for evaluation of palpitations.  Patient had COVID-19 infection about 9 to 10 weeks ago and starting August 2022 has been experiencing palpitations and elevated blood pressures with difficulty with sleeping since then.  Patient states that the palpitations are predominantly present during the night hours.  These come on when she is trying to go to sleep or she wakes up in the middle of the night with what she describes as " heart pounding."  No improving or worsening factors.  No near-syncope or syncopal event.  The symptoms could last anywhere from minutes up to hours.  Since mid August 2022 the symptoms have slightly improved in intensity, frequency, and duration.  She was given clonidine for elevated blood pressures which she took for about 2 nights and did not do well and had to go to the ER for further evaluation.  Since then she is off of clonidine.  Her blood pressures have somewhat normalized but she still keeping an eye on it.  No known thyroid disease.  No history of anemia.  She consumes 1 cup of coffee on a daily basis and 1 can of soda.  She denies the use of illicit drugs, stimulants, energy  drinks, weight loss supplements, and no new over-the-counter medications.  She is also cut down on her allergy medications.  Since August 2022 she is also experiences chest discomfort.  The discomfort is located over the left anterior chest wall.  She describes it as a pinch like sensation, at times brought on by effort related activities and resolves with rest.  The symptoms usually occur a couple times a week.  Nonradiating.  And no change in overall functional status.  Denies prior history of coronary artery disease, myocardial infarction, congestive heart failure, deep venous thrombosis, pulmonary embolism, stroke, transient ischemic attack.  No family history of premature coronary disease or sudden cardiac death.  FUNCTIONAL STATUS: Walks around 30-45 minutes every other day.  Otherwise lives an active lifestyle.  ALLERGIES: No Known Allergies  MEDICATION LIST PRIOR TO VISIT: Current Meds  Medication Sig   albuterol (VENTOLIN HFA) 108 (90 Base) MCG/ACT inhaler INHALE 2 PUFFS INTO THE LUNGS EVERY 6 HOURS AS NEEDED FOR WHEEZING OR SHORTNESS OF BREATH   Calcium-Phosphorus-Vitamin D (CALCIUM/VITAMIN D3/ADULT GUMMY PO) Take by mouth daily.   Multiple Vitamin (MULTIVITAMIN) capsule Take 1 capsule by mouth daily.   NON FORMULARY Citrucell Fiber Supplement   pantoprazole (PROTONIX) 40 MG tablet Take 40 mg by mouth every morning.     PAST MEDICAL HISTORY: Past Medical History:  Diagnosis Date   GERD (gastroesophageal reflux disease)    History of COVID-19    Sinus trouble    Sleep apnea  PAST SURGICAL HISTORY: Past Surgical History:  Procedure Laterality Date   WISDOM TOOTH EXTRACTION      FAMILY HISTORY: The patient family history includes Breast cancer in an other family member; Celiac disease in her brother; Colon cancer in an other family member; Lung cancer in her father and mother; Rheum arthritis in her mother; Scleroderma in her mother.  SOCIAL HISTORY:  The patient   reports that she quit smoking about 9 years ago. Her smoking use included cigarettes. She has a 6.25 pack-year smoking history. She has never used smokeless tobacco. She reports current alcohol use. She reports that she does not use drugs.  REVIEW OF SYSTEMS: Review of Systems  Constitutional: Negative for chills and fever.  HENT:  Negative for hoarse voice and nosebleeds.   Eyes:  Negative for discharge, double vision and pain.  Cardiovascular:  Negative for chest pain, claudication, dyspnea on exertion, leg swelling, near-syncope, orthopnea, palpitations, paroxysmal nocturnal dyspnea and syncope.  Respiratory:  Negative for hemoptysis and shortness of breath.   Musculoskeletal:  Negative for muscle cramps and myalgias.  Gastrointestinal:  Negative for abdominal pain, constipation, diarrhea, hematemesis, hematochezia, melena, nausea and vomiting.  Neurological:  Negative for dizziness and light-headedness.   PHYSICAL EXAM: Vitals with BMI 08/15/2021 07/30/2021 07/30/2021  Height '5\' 4"'$  - -  Weight 194 lbs - -  BMI [id number] - -  Systolic [id number]  Diastolic 79 90 87  Pulse 87 57 55    CONSTITUTIONAL: Well-developed and well-nourished. No acute distress.  SKIN: Skin is warm and dry. No rash noted. No cyanosis. No pallor. No jaundice HEAD: Normocephalic and atraumatic.  EYES: No scleral icterus MOUTH/THROAT: Moist oral membranes.  NECK: No JVD present. No thyromegaly noted. No carotid bruits  LYMPHATIC: No visible cervical adenopathy.  CHEST Normal respiratory effort. No intercostal retractions  LUNGS: Clear to auscultation bilaterally. No stridor. No wheezes. No rales.  CARDIOVASCULAR: Regular rate and rhythm, positive S1-S2, no murmurs rubs or gallops appreciated. ABDOMINAL: Obese, soft, nontender, nondistended, positive bowel sounds all 4 quadrants. No apparent ascites.  EXTREMITIES: No peripheral edema, +2 PT/DP bilaterally  HEMATOLOGIC: No significant bruising NEUROLOGIC: Oriented  to person, place, and time. Nonfocal. Normal muscle tone.  PSYCHIATRIC: Normal mood and affect. Normal behavior. Cooperative  CARDIAC DATABASE: EKG: 08/15/2021: Normal sinus rhythm, 60 bpm, normal axis, without underlying ischemia or injury pattern.    Echocardiogram: No results found for this or any previous visit from the past 1095 days.    Stress Testing: No results found for this or any previous visit from the past 1095 days.   Heart Catheterization: None  LABORATORY DATA: External Labs: Collected: 07/26/2021 BUN 7, creatinine 0.6 Sodium 139, potassium 4.1, chloride 101, bicarb 25. AST 14, ALT 9, alkaline phosphatase 51 Hemoglobin 12.7 g/dL, hematocrit 38.6% TSH 1.22  07/20/2021: TSH 1.73  IMPRESSION:    ICD-10-CM   1. Palpitations  R00.2 EKG 12-Lead    LONG TERM MONITOR (3-14 DAYS)    2. Precordial pain  R07.2 PCV ECHOCARDIOGRAM COMPLETE    PCV CARDIAC STRESS TEST    3. History of COVID-19  Z86.16     4. Sleep apnea, unspecified type  G47.30     5. Class 1 obesity due to excess calories without serious comorbidity with body mass index (BMI) of 33.0 to 33.9 in adult  E66.09    Z68.33     6. Former smoker  Z87.891        RECOMMENDATIONS: Prince Nayyar is a 55  y.o. female whose past medical history and cardiac risk factors include: GERD, anxiety, obesity due to excess calories, hypertension (after COVID infection), former smoker, mild sleep apnea, Hx of COVID 19 infection.   Palpitations No identifiable reversible cause. Outside labs provided by the referring physician reviewed. EKG overall nonischemic. May be secondary to the recent inflammation associated with COVID-19 infection. Proceed with 14-day extended Holter monitor to evaluate for dysrhythmias.  Precordial pain Symptoms suggestive of possible noncardiac etiology. Echocardiogram will be ordered to evaluate for structural heart disease and left ventricular systolic function. Exercise treadmill  stress test to evaluate for functional status and exercise-induced ischemia.  Class 1 obesity due to excess calories without serious comorbidity with body mass index (BMI) of 33.0 to 33.9 in adult Body mass index is 33.3 kg/m. I reviewed with the patient the importance of diet, regular physical activity/exercise, weight loss.   Patient is educated on increasing physical activity gradually as tolerated.  With the goal of moderate intensity exercise for 30 minutes a day 5 days a week.  Former smoker Educated on the importance of continued smoking cessation.  FINAL MEDICATION LIST END OF ENCOUNTER: No orders of the defined types were placed in this encounter.   Medications Discontinued During This Encounter  Medication Reason   fluticasone (FLONASE) 50 MCG/ACT nasal spray Change in therapy   levocetirizine (XYZAL) 5 MG tablet Change in therapy   LAGEVRIO 200 MG CAPS Error     Current Outpatient Medications:    albuterol (VENTOLIN HFA) 108 (90 Base) MCG/ACT inhaler, INHALE 2 PUFFS INTO THE LUNGS EVERY 6 HOURS AS NEEDED FOR WHEEZING OR SHORTNESS OF BREATH, Disp: 6.7 g, Rfl: 0   Calcium-Phosphorus-Vitamin D (CALCIUM/VITAMIN D3/ADULT GUMMY PO), Take by mouth daily., Disp: , Rfl:    Multiple Vitamin (MULTIVITAMIN) capsule, Take 1 capsule by mouth daily., Disp: , Rfl:    NON FORMULARY, Citrucell Fiber Supplement, Disp: , Rfl:    pantoprazole (PROTONIX) 40 MG tablet, Take 40 mg by mouth every morning., Disp: , Rfl:    ALPRAZolam (XANAX) 0.25 MG tablet, Take 0.25 mg by mouth daily as needed., Disp: , Rfl:   Orders Placed This Encounter  Procedures   LONG TERM MONITOR (3-14 DAYS)   PCV CARDIAC STRESS TEST   EKG 12-Lead   PCV ECHOCARDIOGRAM COMPLETE    There are no Patient Instructions on file for this visit.   --Continue cardiac medications as reconciled in final medication list. --Return in about 6 weeks (around 09/26/2021) for Follow up palpitation and CP. , Review test results. Or  sooner if needed. --Continue follow-up with your primary care physician regarding the management of your other chronic comorbid conditions.  Patient's questions and concerns were addressed to her satisfaction. She voices understanding of the instructions provided during this encounter.   This note was created using a voice recognition software as a result there may be grammatical errors inadvertently enclosed that do not reflect the nature of this encounter. Every attempt is made to correct such errors.  Rex Kras, [place], [facility]  Pager: [phone] Office: [phone]

## 2021-08-15 ENCOUNTER — Ambulatory Visit: Payer: 59 | Admitting: Cardiology

## 2021-08-15 ENCOUNTER — Other Ambulatory Visit: Payer: Self-pay

## 2021-08-15 ENCOUNTER — Encounter: Payer: Self-pay | Admitting: Cardiology

## 2021-08-15 VITALS — BP 124/79 | HR 87 | Temp 97.3°F | Resp 16 | Ht 64.0 in | Wt 194.0 lb

## 2021-08-15 DIAGNOSIS — Z6833 Body mass index (BMI) 33.0-33.9, adult: Secondary | ICD-10-CM

## 2021-08-15 DIAGNOSIS — G4730 Sleep apnea, unspecified: Secondary | ICD-10-CM

## 2021-08-15 DIAGNOSIS — Z87891 Personal history of nicotine dependence: Secondary | ICD-10-CM

## 2021-08-15 DIAGNOSIS — R002 Palpitations: Secondary | ICD-10-CM

## 2021-08-15 DIAGNOSIS — E6609 Other obesity due to excess calories: Secondary | ICD-10-CM

## 2021-08-15 DIAGNOSIS — R072 Precordial pain: Secondary | ICD-10-CM

## 2021-08-15 DIAGNOSIS — Z8616 Personal history of COVID-19: Secondary | ICD-10-CM

## 2021-08-19 ENCOUNTER — Encounter: Payer: Self-pay | Admitting: Cardiology

## 2021-08-22 ENCOUNTER — Ambulatory Visit: Payer: 59

## 2021-08-22 ENCOUNTER — Inpatient Hospital Stay: Payer: 59

## 2021-08-22 ENCOUNTER — Other Ambulatory Visit: Payer: Self-pay

## 2021-08-22 DIAGNOSIS — R072 Precordial pain: Secondary | ICD-10-CM

## 2021-08-23 ENCOUNTER — Telehealth: Payer: Self-pay

## 2021-08-23 NOTE — Telephone Encounter (Signed)
error 

## 2021-08-27 ENCOUNTER — Other Ambulatory Visit: Payer: 59

## 2021-08-27 ENCOUNTER — Inpatient Hospital Stay: Payer: 59

## 2021-08-27 DIAGNOSIS — R002 Palpitations: Secondary | ICD-10-CM

## 2021-08-30 ENCOUNTER — Other Ambulatory Visit: Payer: Self-pay | Admitting: Cardiology

## 2021-08-30 DIAGNOSIS — R072 Precordial pain: Secondary | ICD-10-CM

## 2021-08-30 MED ORDER — METOPROLOL TARTRATE 25 MG PO TABS: 25.0000 mg | ORAL_TABLET | Freq: Two times a day (BID) | ORAL | 0 refills | Status: DC

## 2021-09-09 ENCOUNTER — Telehealth: Payer: Self-pay

## 2021-09-09 NOTE — Telephone Encounter (Signed)
Please speak to me about this patient when you get here. Thanks

## 2021-09-09 NOTE — Telephone Encounter (Signed)
Please let me know if there is any other questions that the patient may have regarding her upcoming coronary CTA.

## 2021-09-09 NOTE — Telephone Encounter (Signed)
S/w pt

## 2021-09-11 LAB — BASIC METABOLIC PANEL
BUN/Creatinine Ratio: 13 (ref 9–23)
BUN: 9 mg/dL (ref 6–24)
CO2: 23 mmol/L (ref 20–29)
Calcium: 9.7 mg/dL (ref 8.7–10.2)
Chloride: 104 mmol/L (ref 96–106)
Creatinine, Ser: 0.7 mg/dL (ref 0.57–1.00)
Glucose: 76 mg/dL (ref 70–99)
Potassium: 4.4 mmol/L (ref 3.5–5.2)
Sodium: 142 mmol/L (ref 134–144)
eGFR: 104 mL/min/{1.73_m2} (ref >59)

## 2021-09-11 LAB — HCG, SERUM, QUALITATIVE: hCG,Beta Subunit,Qual,Serum: NEGATIVE m[IU]/mL (ref <6)

## 2021-09-12 ENCOUNTER — Telehealth (HOSPITAL_COMMUNITY): Payer: Self-pay | Admitting: *Deleted

## 2021-09-12 NOTE — Telephone Encounter (Signed)
Reaching out to patient to offer assistance regarding upcoming cardiac imaging study; pt verbalizes understanding of appt date/time, parking situation and where to check in, pre-test NPO status and medications ordered, and verified current allergies; name and call back number provided for further questions should they arise  Ziasia Lenoir RN Navigator Cardiac Imaging [facility] [phone] office [phone] cell  Patient to take 25mg metoprolol tartrate two hours prior to cardiac CT scan. 

## 2021-09-16 ENCOUNTER — Ambulatory Visit (HOSPITAL_COMMUNITY)
Admission: RE | Admit: 2021-09-16 | Discharge: 2021-09-16 | Disposition: A | Discharge status: Home / Self Care | Payer: 59 | Source: Ambulatory Visit | Attending: Cardiology | Admitting: Cardiology

## 2021-09-16 ENCOUNTER — Other Ambulatory Visit: Payer: Self-pay

## 2021-09-16 DIAGNOSIS — R072 Precordial pain: Secondary | ICD-10-CM | POA: Diagnosis not present

## 2021-09-16 MED ORDER — NITROGLYCERIN 0.4 MG SL SUBL: 0.8000 mg | SUBLINGUAL_TABLET | Freq: Once | SUBLINGUAL | Status: AC

## 2021-09-16 MED ORDER — IOHEXOL 350 MG/ML SOLN
95.0000 mL | Freq: Once | INTRAVENOUS | Status: AC | PRN
  Administered 2021-09-16: 95 mL via INTRAVENOUS

## 2021-09-16 MED ORDER — NITROGLYCERIN 0.4 MG SL SUBL
SUBLINGUAL_TABLET | SUBLINGUAL | Status: AC
  Administered 2021-09-16: 0.8 mg via SUBLINGUAL
  Filled 2021-09-16: qty 2

## 2021-09-18 DIAGNOSIS — R072 Precordial pain: Secondary | ICD-10-CM | POA: Insufficient documentation

## 2021-09-23 ENCOUNTER — Other Ambulatory Visit: Payer: Self-pay

## 2021-09-23 ENCOUNTER — Ambulatory Visit
Admission: RE | Admit: 2021-09-23 | Discharge: 2021-09-23 | Disposition: A | Discharge status: Home / Self Care | Payer: 59 | Source: Ambulatory Visit | Attending: Gastroenterology | Admitting: Gastroenterology

## 2021-09-23 DIAGNOSIS — R1011 Right upper quadrant pain: Secondary | ICD-10-CM

## 2021-09-26 ENCOUNTER — Encounter: Payer: Self-pay | Admitting: Cardiology

## 2021-09-26 ENCOUNTER — Other Ambulatory Visit: Payer: Self-pay

## 2021-09-26 ENCOUNTER — Ambulatory Visit: Payer: 59 | Admitting: Cardiology

## 2021-09-26 VITALS — BP 129/84 | HR 67 | Temp 98.1°F | Resp 16 | Ht 64.0 in | Wt 197.6 lb

## 2021-09-26 DIAGNOSIS — G4730 Sleep apnea, unspecified: Secondary | ICD-10-CM

## 2021-09-26 DIAGNOSIS — R931 Abnormal findings on diagnostic imaging of heart and coronary circulation: Secondary | ICD-10-CM

## 2021-09-26 DIAGNOSIS — Z87891 Personal history of nicotine dependence: Secondary | ICD-10-CM

## 2021-09-26 DIAGNOSIS — Z8616 Personal history of COVID-19: Secondary | ICD-10-CM

## 2021-09-26 DIAGNOSIS — E6609 Other obesity due to excess calories: Secondary | ICD-10-CM

## 2021-09-26 DIAGNOSIS — R002 Palpitations: Secondary | ICD-10-CM

## 2021-09-26 NOTE — Progress Notes (Signed)
Date:  08/15/2021   ID:  Ruth Roberts, DOB 1966/03/19, MRN [id number]  PCP:  Faustino Congress, NP  Cardiologist:  Rex Kras, DO, [facility]  (established care 08/15/2021)  Date: 09/26/21 Last Office Visit: 08/15/2021   Chief Complaint  Patient presents with   Palpitations   Results   Follow-up    HPI  Ruth Roberts is a 55 y.o. female who presents to the office with a chief complaint of "reevaluation of palpitations and discuss test results" Patient's past medical history and cardiovascular risk factors include: Mild coronary artery calcification, GERD, anxiety, obesity due to excess calories, hypertension (after COVID infection), former smoker, mild sleep apnea, Hx of COVID 19 infection.   She is referred to the office at the request of Faustino Congress, NP for evaluation of palpitations.  Since her COVID-19 infection she is been experiencing palpitations and elevated blood pressures.  During initial consultation she also was complaining of chest pain which appeared to be noncardiac in etiology.  With regards to chest pain patient was recommended to have a GXT and echocardiogram to evaluate for structural heart disease.  Echocardiogram notes preserved LVEF, normal diastolic filling pattern, no significant valvular heart disease.  Patient has appropriate functional capacity for age, stress ECG noted horizontal ST depressions in inferolateral leads which resolved into 1 minute into recovery along with chest pain that was nonlimiting.  Given the nonlimiting chest pain and stress ECG findings patient subsequently underwent coronary CTA.  She was found to have mild coronary artery calcification and minimal nonobstructive CAD.  Images of the coronary CTA were reviewed with her in great detail at today's visit and findings explained in great details.  With regards to palpitations she underwent a 14-day extended Holter monitor and was noted to have an average heart rate of 90 bpm.  She  also had a sinus tachycardia burden of approximately 11%.  And 13 patient triggered events noted normal sinus rhythm without dysrhythmias.  No significant PVC or PAC burden.  Clinically patient states that the palpitations are overall very well controlled.  FUNCTIONAL STATUS: Walks around 30-45 minutes every other day.  Otherwise lives an active lifestyle.  ALLERGIES: No Known Allergies  MEDICATION LIST PRIOR TO VISIT: Current Meds  Medication Sig   albuterol (VENTOLIN HFA) 108 (90 Base) MCG/ACT inhaler INHALE 2 PUFFS INTO THE LUNGS EVERY 6 HOURS AS NEEDED FOR WHEEZING OR SHORTNESS OF BREATH   ALPRAZolam (XANAX) 0.25 MG tablet Take 0.25 mg by mouth daily as needed.   Calcium-Phosphorus-Vitamin D (CALCIUM/VITAMIN D3/ADULT GUMMY PO) Take by mouth daily.   Multiple Vitamin (MULTIVITAMIN) capsule Take 1 capsule by mouth daily.   NON FORMULARY Citrucell Fiber Supplement   pantoprazole (PROTONIX) 40 MG tablet Take 40 mg by mouth every morning.     PAST MEDICAL HISTORY: Past Medical History:  Diagnosis Date   Coronary artery calcification    GERD (gastroesophageal reflux disease)    History of COVID-19    Sinus trouble    Sleep apnea     PAST SURGICAL HISTORY: Past Surgical History:  Procedure Laterality Date   WISDOM TOOTH EXTRACTION      FAMILY HISTORY: The patient family history includes Breast cancer in an other family member; Celiac disease in her brother; Colon cancer in an other family member; Lung cancer in her father and mother; Rheum arthritis in her mother; Scleroderma in her mother.  SOCIAL HISTORY:  The patient  reports that she quit smoking about 9 years ago. Her smoking use  included cigarettes. She has a 6.25 pack-year smoking history. She has never used smokeless tobacco. She reports current alcohol use. She reports that she does not use drugs.  REVIEW OF SYSTEMS: Review of Systems  Constitutional: Negative for chills and fever.  HENT:  Negative for hoarse voice  and nosebleeds.   Eyes:  Negative for discharge, double vision and pain.  Cardiovascular:  Negative for chest pain, claudication, dyspnea on exertion, leg swelling, near-syncope, orthopnea, palpitations, paroxysmal nocturnal dyspnea and syncope.  Respiratory:  Negative for hemoptysis and shortness of breath.   Musculoskeletal:  Negative for muscle cramps and myalgias.  Gastrointestinal:  Negative for abdominal pain, constipation, diarrhea, hematemesis, hematochezia, melena, nausea and vomiting.  Neurological:  Negative for dizziness and light-headedness.   PHYSICAL EXAM: Vitals with BMI 09/26/2021 09/16/2021 09/16/2021  Height _0  - -  Weight 197 lbs 10 oz - -  BMI 24.5 - -  Systolic 809 983 382  Diastolic 84 78 87  Pulse 67 - -    CONSTITUTIONAL: Well-developed and well-nourished. No acute distress.  SKIN: Skin is warm and dry. No rash noted. No cyanosis. No pallor. No jaundice HEAD: Normocephalic and atraumatic.  EYES: No scleral icterus MOUTH/THROAT: Moist oral membranes.  NECK: No JVD present. No thyromegaly noted. No carotid bruits  LYMPHATIC: No visible cervical adenopathy.  CHEST Normal respiratory effort. No intercostal retractions  LUNGS: Clear to auscultation bilaterally. No stridor. No wheezes. No rales.  CARDIOVASCULAR: Regular rate and rhythm, positive S1-S2, no murmurs rubs or gallops appreciated. ABDOMINAL: Obese, soft, nontender, nondistended, positive bowel sounds all 4 quadrants. No apparent ascites.  EXTREMITIES: No peripheral edema, +2 PT/DP bilaterally  HEMATOLOGIC: No significant bruising NEUROLOGIC: Oriented to person, place, and time. Nonfocal. Normal muscle tone.  PSYCHIATRIC: Normal mood and affect. Normal behavior. Cooperative  CARDIAC DATABASE: EKG: 08/15/2021: Normal sinus rhythm, 60 bpm, normal axis, without underlying ischemia or injury pattern.    Echocardiogram: 08/22/2021: Normal LV systolic function with visual EF 60-65%. Left ventricle cavity  is normal in size. Normal left ventricular wall thickness. Normal global wall motion. Normal diastolic filling pattern, normal LAP. No significant valvular heart disease. No prior study for comparison.  Stress Testing: Exercise treadmill stress test 08/22/2021: Functional status: Good Chest pain: Yes. Reason for stopping exercise: Fatigue/weakness Hypertensive response to exercise: No. Exercise time 9 minutes 39 seconds on Bruce protocol, achieved 11.21 METS, 99% age-predicted maximum heart rate.  Stress ECG positive for ischemia.  Intermediate risk study -given the nonlimiting anginal discomfort with exercise, stress ECG notes horizontal ST depressions in the inferolateral leads which resolved within 1 minute into recovery, Duke Treadmill score 0.65.  Heart Catheterization: None  Coronary CTA 09/16/2021: 1. Total coronary calcium score of 17.2. This was 90th percentile for age and sex matched control. 2. Normal coronary origin with right dominance. 3. CAD-RADS = 1 Minimal non-obstructive CAD. Proximal LAD minimal stenosis (0-24%) due to calcified plaque, mid to apical LAD is patent.  Otherwise left main, LCx, and RCA are patent. 4. No significant incidental noncardiac findings noted.  14 day extended Holter monitor: Dominant rhythm normal sinus, followed by sinus tachycardia (burden 11%). Heart rate 44-193 bpm. Avg HR 90, bpm. No atrial fibrillation, ventricular tachycardia, high grade AV block, pauses (3 seconds or longer). Rare episodes of paroxysmal supraventricular tachycardia. Total ventricular ectopic burden <1%. Total supraventricular ectopic burden <1%. Patient triggered events: 13 underlying rhythm is normal sinus without dysrhythmia.  LABORATORY DATA: External Labs: Collected: 07/26/2021 BUN 7, creatinine 0.6 Sodium 139, potassium  4.1, chloride 101, bicarb 25. AST 14, ALT 9, alkaline phosphatase 51 Hemoglobin 12.7 g/dL, hematocrit 38.6% TSH 1.22  07/20/2021: TSH  1.73  IMPRESSION:    ICD-10-CM   1. Palpitations  R00.2     2. Agatston CAC score, <100  R93.1 Lipid Panel With LDL/HDL Ratio    LDL cholesterol, direct    CMP14+EGFR    3. History of COVID-19  Z86.16     4. Sleep apnea, unspecified type  G47.30     5. Class 1 obesity due to excess calories without serious comorbidity with body mass index (BMI) of 33.0 to 33.9 in adult  E66.09    Z68.33     6. Former smoker  Z87.891        RECOMMENDATIONS: Ruth Roberts is a 55 y.o. female whose past medical history and cardiac risk factors include: Mild coronary artery calcification, GERD, anxiety, obesity due to excess calories, hypertension (after COVID infection), former smoker, mild sleep apnea, Hx of COVID 19 infection.   Palpitations Present since her COVID-19 infection. Otherwise no identifiable reversible cause. EKG overall nonischemic. A 14-day extended Holter monitor results reviewed with her in great detail.  She has an average heart rate of 90 bpm and a sinus tachycardia burden of approximately 11%.  We discussed considering beta-blocker therapy; however, she would like to hold off on medications at this time.  This is very appropriate if her symptoms are overall improving.  Continue to monitor.  Agatston CAC score, <100 Total CAC 17.2 AU, 90th percentile Recommended aspirin and statin therapy. However, patient is reluctant to start statin therapy at this time. That shared decision was to check a fasting lipid profile and calculate her estimated 10-year risk of ASCVD at the next office visit and formulate a plan of care. Patient is agreeable. Educated on the importance of secondary prevention. Extensively reviewed her coronary CTA images with her in great details during today's encounter.  Class 1 obesity due to excess calories without serious comorbidity with body mass index (BMI) of 33.0 to 33.9 in adult Body mass index is 33.92 kg/m. I reviewed with the patient the  importance of diet, regular physical activity/exercise, weight loss.   Patient is educated on increasing physical activity gradually as tolerated.  With the goal of moderate intensity exercise for 30 minutes a day 5 days a week.   Former smoker Educated on the importance of complete and continued smoking cessation  FINAL MEDICATION LIST END OF ENCOUNTER: No orders of the defined types were placed in this encounter.   Medications Discontinued During This Encounter  Medication Reason   metoprolol tartrate (LOPRESSOR) 25 MG tablet Error     Current Outpatient Medications:    albuterol (VENTOLIN HFA) 108 (90 Base) MCG/ACT inhaler, INHALE 2 PUFFS INTO THE LUNGS EVERY 6 HOURS AS NEEDED FOR WHEEZING OR SHORTNESS OF BREATH, Disp: 6.7 g, Rfl: 0   ALPRAZolam (XANAX) 0.25 MG tablet, Take 0.25 mg by mouth daily as needed., Disp: , Rfl:    Calcium-Phosphorus-Vitamin D (CALCIUM/VITAMIN D3/ADULT GUMMY PO), Take by mouth daily., Disp: , Rfl:    Multiple Vitamin (MULTIVITAMIN) capsule, Take 1 capsule by mouth daily., Disp: , Rfl:    NON FORMULARY, Citrucell Fiber Supplement, Disp: , Rfl:    pantoprazole (PROTONIX) 40 MG tablet, Take 40 mg by mouth every morning., Disp: , Rfl:   Orders Placed This Encounter  Procedures   Lipid Panel With LDL/HDL Ratio   LDL cholesterol, direct   CMP14+EGFR  There are no Patient Instructions on file for this visit.   --Continue cardiac medications as reconciled in final medication list. --Return in about 7 weeks (around 11/14/2021) for Follow up, Coronary artery calcification, Lipid. Or sooner if needed. --Continue follow-up with your primary care physician regarding the management of your other chronic comorbid conditions.  Patient's questions and concerns were addressed to her satisfaction. She voices understanding of the instructions provided during this encounter.   This note was created using a voice recognition software as a result there may be grammatical  errors inadvertently enclosed that do not reflect the nature of this encounter. Every attempt is made to correct such errors.  Rex Kras, [place], [facility]  Pager: [phone] Office: [phone]

## 2021-09-27 ENCOUNTER — Other Ambulatory Visit: Payer: Self-pay | Admitting: Pulmonary Disease

## 2021-09-27 DIAGNOSIS — R062 Wheezing: Secondary | ICD-10-CM

## 2021-09-30 ENCOUNTER — Ambulatory Visit (INDEPENDENT_AMBULATORY_CARE_PROVIDER_SITE_OTHER): Payer: 59 | Admitting: Adult Health

## 2021-09-30 ENCOUNTER — Other Ambulatory Visit: Payer: Self-pay

## 2021-09-30 ENCOUNTER — Encounter: Payer: Self-pay | Admitting: Adult Health

## 2021-09-30 ENCOUNTER — Ambulatory Visit (INDEPENDENT_AMBULATORY_CARE_PROVIDER_SITE_OTHER): Payer: 59 | Admitting: Pulmonary Disease

## 2021-09-30 DIAGNOSIS — J3089 Other allergic rhinitis: Secondary | ICD-10-CM | POA: Diagnosis not present

## 2021-09-30 DIAGNOSIS — G4733 Obstructive sleep apnea (adult) (pediatric): Secondary | ICD-10-CM

## 2021-09-30 DIAGNOSIS — R062 Wheezing: Secondary | ICD-10-CM

## 2021-09-30 LAB — PULMONARY FUNCTION TEST
DL/VA % pred: 146 %
DL/VA: 6.27 ml/min/mmHg/L
DLCO cor % pred: 128 %
DLCO cor: 26.7 ml/min/mmHg
DLCO unc % pred: 128 %
DLCO unc: 26.7 ml/min/mmHg
FEF 25-75 Post: 3.19 L/sec
FEF 25-75 Pre: 2.7 L/sec
FEF2575-%Change-Post: 18 %
FEF2575-%Pred-Post: 120 %
FEF2575-%Pred-Pre: 102 %
FEV1-%Change-Post: 4 %
FEV1-%Pred-Post: 94 %
FEV1-%Pred-Pre: 90 %
FEV1-Post: 2.57 L
FEV1-Pre: 2.47 L
FEV1FVC-%Change-Post: 2 %
FEV1FVC-%Pred-Pre: 103 %
FEV6-%Change-Post: 1 %
FEV6-%Pred-Post: 89 %
FEV6-%Pred-Pre: 88 %
FEV6-Post: 3.04 L
FEV6-Pre: 2.98 L
FEV6FVC-%Change-Post: 0 %
FEV6FVC-%Pred-Post: 102 %
FEV6FVC-%Pred-Pre: 102 %
FVC-%Change-Post: 1 %
FVC-%Pred-Post: 87 %
FVC-%Pred-Pre: 86 %
FVC-Post: 3.04 L
FVC-Pre: 2.99 L
Post FEV1/FVC ratio: 85 %
Post FEV6/FVC ratio: 100 %
Pre FEV1/FVC ratio: 83 %
Pre FEV6/FVC Ratio: 100 %
RV % pred: 108 %
RV: 2.01 L
TLC % pred: 95 %
TLC: 4.84 L

## 2021-09-30 MED ORDER — ALBUTEROL SULFATE HFA 108 (90 BASE) MCG/ACT IN AERS: 2.0000 | INHALATION_SPRAY | Freq: Four times a day (QID) | RESPIRATORY_TRACT | 3 refills | Status: DC | PRN

## 2021-09-30 NOTE — Progress Notes (Signed)
@Patient  ID: Ruth Roberts, female    DOB: 1966/02/16, 55 y.o.   MRN: [id number]  Chief Complaint  Patient presents with   Follow-up    Referring provider: Leighton Ruff, MD  HPI: 55 year old female former smoker followed for obstructive sleep apnea and mild intermittent asthma Patient is a lawyer  TEST/EVENTS :  HST 08/31/19- Mild OSA    09/30/2021 Follow up ; Asthma and OSA  Patient returns for a 81-month follow-up.  Patient was seen last visit.  She had a home sleep study in September 2020 that showed mild sleep apnea.  Patient says she tried her CPAP was unable to tolerate it.  She was referred to orthodontist Dr. Ron Parker and has started to wear a oral appliance.  Patient says this is really helped her quite a bit.  She feels that she is sleeping better and has decreased daytime sleepiness.  Patient is has chronic rhinitis.  She has been seen by allergist and ENT in the past.  In June 2022.  Patient tested positive for COVID-19 infection.  She took Molnupiravir x5 days.  She has some ongoing symptoms of cough and shortness of breath.  Chest x-ray showed no acute process. Patient recently had a coronary CT, this showed no suspicious pulmonary nodules or masses. Patient was set up for pulmonary function testing that showed normal lung function with an FEV1 at 94%, ratio 85, FVC at 87%, no significant bronchodilator response, DLCO is 128%. Since last visit patient says she is feeling better.  Feels that her breathing has improved. Patient denies any significant cough or wheezing.  Has an albuterol inhaler that she rarely uses.  Needs a refill.   No Known Allergies  Immunization History  Administered Date(s) Administered   Influenza,inj,Quad PF,6+ Mos 10/27/2018, 09/25/2019, 09/25/2021   Influenza-Unspecified 09/13/2018, 09/23/2019   PFIZER(Purple Top)SARS-COV-2 Vaccination 03/10/2020, 04/03/2020, 11/01/2020, 03/19/2021    Past Medical History:  Diagnosis Date   Coronary  artery calcification    GERD (gastroesophageal reflux disease)    History of COVID-19    Sinus trouble    Sleep apnea     Tobacco History: Social History   Tobacco Use  Smoking Status Former   Packs/day: 0.25   Years: 25.00   Pack years: 6.25   Types: Cigarettes   Quit date: 2013   Years since quitting: 9.7  Smokeless Tobacco Never  Tobacco Comments   social    Counseling given: Not Answered Tobacco comments: social    Outpatient Medications Prior to Visit  Medication Sig Dispense Refill   albuterol (VENTOLIN HFA) 108 (90 Base) MCG/ACT inhaler INHALE 2 PUFFS INTO THE LUNGS EVERY 6 HOURS AS NEEDED FOR WHEEZING OR SHORTNESS OF BREATH 6.7 g 0   ALPRAZolam (XANAX) 0.25 MG tablet Take 0.25 mg by mouth daily as needed.     aspirin 81 MG chewable tablet Chew 81 mg by mouth daily.     Calcium-Phosphorus-Vitamin D (CALCIUM/VITAMIN D3/ADULT GUMMY PO) Take by mouth daily.     famotidine (PEPCID) 20 MG tablet Take 20 mg by mouth at bedtime.     levocetirizine (XYZAL) 5 MG tablet Take 5 mg by mouth every evening. 1/2 tab     mometasone (NASONEX) 50 MCG/ACT nasal spray Place 1 spray into the nose daily. 1 spray each nostril     Multiple Vitamin (MULTIVITAMIN) capsule Take 1 capsule by mouth daily.     NON FORMULARY Citrucell Fiber Supplement     pantoprazole (PROTONIX) 20 MG tablet Take 20 mg  by mouth daily.     pantoprazole (PROTONIX) 40 MG tablet Take 40 mg by mouth every morning.     No facility-administered medications prior to visit.     Review of Systems:   Constitutional:   No  weight loss, night sweats,  Fevers, chills, fatigue, or  lassitude.  HEENT:   No headaches,  Difficulty swallowing,  Tooth/dental problems, or  Sore throat,                No sneezing, itching, ear ache,  +nasal congestion, post nasal drip,   CV:  No chest pain,  Orthopnea, PND, swelling in lower extremities, anasarca, dizziness, palpitations, syncope.   GI  No heartburn, indigestion, abdominal  pain, nausea, vomiting, diarrhea, change in bowel habits, loss of appetite, bloody stools.   Resp: No shortness of breath with exertion or at rest.  No excess mucus, no productive cough,  No non-productive cough,  No coughing up of blood.  No change in color of mucus.  No wheezing.  No chest wall deformity  Skin: no rash or lesions.  GU: no dysuria, change in color of urine, no urgency or frequency.  No flank pain, no hematuria   MS:  No joint pain or swelling.  No decreased range of motion.  No back pain.    Physical Exam  BP 106/70 (BP Location: Left Arm, Patient Position: Sitting, Cuff Size: Normal)   Pulse 70   Temp 98.9 F (37.2 C) (Oral)   Ht 5\' 4"  (1.626 m)   Wt 198 lb (89.8 kg)   SpO2 100%   BMI 33.99 kg/m   GEN: A/Ox3; pleasant , NAD, well nourished    HEENT:  [place]/AT,   NOSE-clear, THROAT-clear, no lesions, no postnasal drip or exudate noted.   NECK:  Supple w/ fair ROM; no JVD; normal carotid impulses w/o bruits; no thyromegaly or nodules palpated; no lymphadenopathy.    RESP  Clear  P & A; w/o, wheezes/ rales/ or rhonchi. no accessory muscle use, no dullness to percussion  CARD:  RRR, no m/r/g, no peripheral edema, pulses intact, no cyanosis or clubbing.  GI:   Soft & nt; nml bowel sounds; no organomegaly or masses detected.   Musco: Warm bil, no deformities or joint swelling noted.   Neuro: alert, no focal deficits noted.    Skin: Warm, no lesions or rashes    Lab Results:  CBC    Component Value Date/Time   WBC 6.3 07/30/2021 0058   RBC 4.18 07/30/2021 0058   HGB 13.1 07/30/2021 0058   HCT 38.9 07/30/2021 0058   PLT 274 07/30/2021 0058   MCV 93.1 07/30/2021 0058   MCH 31.3 07/30/2021 0058   MCHC 33.7 07/30/2021 0058   RDW 12.2 07/30/2021 0058    BMET    Component Value Date/Time   NA 142 09/10/2021 1430   K 4.4 09/10/2021 1430   CL 104 09/10/2021 1430   CO2 23 09/10/2021 1430   GLUCOSE 76 09/10/2021 1430   GLUCOSE 120 (H) 07/30/2021 0058    BUN 9 09/10/2021 1430   CREATININE 0.70 09/10/2021 1430   CALCIUM 9.7 09/10/2021 1430   GFRNONAA >60 07/30/2021 0058   GFRAA 106 12/20/2018 1544    BNP No results found for: BNP  ProBNP    PFT Results Latest Ref Rng & Units 09/30/2021  FVC-Pre L 2.99  FVC-Predicted Pre % 86  FVC-Post L 3.04  FVC-Predicted Post % 87  Pre FEV1/FVC % % 83  Post  FEV1/FCV % % 85  FEV1-Pre L 2.47  FEV1-Predicted Pre % 90  FEV1-Post L 2.57  DLCO uncorrected ml/min/mmHg 26.70  DLCO UNC% % 128  DLCO corrected ml/min/mmHg 26.70  DLCO COR %Predicted % 128  DLVA Predicted % 146  TLC L 4.84  TLC % Predicted % 95  RV % Predicted % 108    No results found for: NITRICOXIDE      Assessment & Plan:   OSA (obstructive sleep apnea) Continue follow-up for oral appliance. Healthy sleep regimen discussed  Perennial and seasonal allergic rhinosinusitis Continue on current regimen.  Continue with trigger prevention  Allergic bronchial hyperresponsiveness Reactive airway/mild intermittent asthma.  Recent flare with COVID-19.  Seems to be back to baseline.  May use albuterol as needed.  Trigger prevention.     Rexene Edison, NP 09/30/2021

## 2021-09-30 NOTE — Progress Notes (Signed)
PFT done today. 

## 2021-09-30 NOTE — Assessment & Plan Note (Signed)
Reactive airway/mild intermittent asthma.  Recent flare with COVID-19.  Seems to be back to baseline.  May use albuterol as needed.  Trigger prevention.

## 2021-09-30 NOTE — Assessment & Plan Note (Signed)
Continue follow-up for oral appliance. Healthy sleep regimen discussed

## 2021-09-30 NOTE — Patient Instructions (Signed)
Allegra daily  As needed nasal congestion  Saline nasal rinses Twice daily  .  Saline nasal gel At bedtime   Oral appliance At bedtime   Continue on Protonix daily  Continue on Pepcid 10mg  At bedtime   GERD diet.  Follow up with Dr. Ander Slade or Jonah Gingras NP in 6  months.  Please contact office for sooner follow up if symptoms do not improve or worsen or seek emergency care

## 2021-09-30 NOTE — Assessment & Plan Note (Signed)
Continue on current regimen.  Continue with trigger prevention

## 2021-10-01 ENCOUNTER — Other Ambulatory Visit (HOSPITAL_COMMUNITY): Payer: Self-pay | Admitting: Gastroenterology

## 2021-10-01 DIAGNOSIS — R1011 Right upper quadrant pain: Secondary | ICD-10-CM

## 2021-10-16 ENCOUNTER — Ambulatory Visit: Payer: 59 | Admitting: Cardiology

## 2021-10-21 ENCOUNTER — Encounter (HOSPITAL_COMMUNITY): Payer: Self-pay

## 2021-10-21 ENCOUNTER — Ambulatory Visit (HOSPITAL_COMMUNITY): Payer: 59

## 2021-11-14 ENCOUNTER — Ambulatory Visit: Payer: 59 | Admitting: Cardiology

## 2021-12-11 LAB — LIPID PANEL WITH LDL/HDL RATIO
Cholesterol, Total: 193 mg/dL (ref 100–199)
HDL: 63 mg/dL (ref >39)
LDL Chol Calc (NIH): 121 mg/dL — ABNORMAL HIGH (ref 0–99)
LDL/HDL Ratio: 1.9 ratio (ref 0.0–3.2)
Triglycerides: 47 mg/dL (ref 0–149)
VLDL Cholesterol Cal: 9 mg/dL (ref 5–40)

## 2021-12-11 LAB — CMP14+EGFR
ALT: 17 IU/L (ref 0–32)
AST: 16 IU/L (ref 0–40)
Albumin/Globulin Ratio: 1.9 (ref 1.2–2.2)
Albumin: 4.3 g/dL (ref 3.8–4.9)
Alkaline Phosphatase: 53 IU/L (ref 44–121)
BUN/Creatinine Ratio: 14 (ref 9–23)
BUN: 10 mg/dL (ref 6–24)
Bilirubin Total: 0.2 mg/dL (ref 0.0–1.2)
CO2: 25 mmol/L (ref 20–29)
Calcium: 9 mg/dL (ref 8.7–10.2)
Chloride: 103 mmol/L (ref 96–106)
Creatinine, Ser: 0.69 mg/dL (ref 0.57–1.00)
Globulin, Total: 2.3 g/dL (ref 1.5–4.5)
Glucose: 84 mg/dL (ref 70–99)
Potassium: 4.1 mmol/L (ref 3.5–5.2)
Sodium: 139 mmol/L (ref 134–144)
Total Protein: 6.6 g/dL (ref 6.0–8.5)
eGFR: 104 mL/min/{1.73_m2} (ref >59)

## 2021-12-11 LAB — LDL CHOLESTEROL, DIRECT: LDL Direct: 121 mg/dL — ABNORMAL HIGH (ref 0–99)

## 2021-12-13 NOTE — Progress Notes (Signed)
Called pt no answer, left a vm

## 2021-12-13 NOTE — Progress Notes (Signed)
Pt called back and was informed not to miss f/u appt. Pt understood

## 2021-12-17 ENCOUNTER — Other Ambulatory Visit: Payer: Self-pay

## 2021-12-17 ENCOUNTER — Ambulatory Visit: Payer: 59 | Admitting: Cardiology

## 2021-12-17 ENCOUNTER — Encounter: Payer: Self-pay | Admitting: Cardiology

## 2021-12-17 VITALS — BP 123/73 | HR 69 | Temp 98.0°F | Resp 16 | Ht 64.0 in | Wt 200.6 lb

## 2021-12-17 DIAGNOSIS — R931 Abnormal findings on diagnostic imaging of heart and coronary circulation: Secondary | ICD-10-CM

## 2021-12-17 DIAGNOSIS — Z8616 Personal history of COVID-19: Secondary | ICD-10-CM

## 2021-12-17 DIAGNOSIS — Z6833 Body mass index (BMI) 33.0-33.9, adult: Secondary | ICD-10-CM

## 2021-12-17 DIAGNOSIS — R002 Palpitations: Secondary | ICD-10-CM

## 2021-12-17 DIAGNOSIS — E6609 Other obesity due to excess calories: Secondary | ICD-10-CM

## 2021-12-17 DIAGNOSIS — Z87891 Personal history of nicotine dependence: Secondary | ICD-10-CM

## 2021-12-17 MED ORDER — ATORVASTATIN CALCIUM 10 MG PO TABS: 10.0000 mg | ORAL_TABLET | Freq: Every day | ORAL | 0 refills | Status: DC

## 2021-12-17 NOTE — Progress Notes (Signed)
ID:  Ruth Roberts, DOB Aug 13, 1966, MRN [id number]  PCP:  Faustino Congress, NP  Cardiologist:  Rex Kras, DO, [facility]  (established care 08/15/2021)  Date: 12/17/21 Last Office Visit: 09/26/2021   Chief Complaint  Patient presents with   Coronary artery calcification   Hyperlipidemia   Follow-up    HPI  Ruth Roberts is a 56 y.o. female who presents to the office with a chief complaint of " 54-monthfollow-up for hyperlipidemia and coronary calcium management." Patient's past medical history and cardiovascular risk factors include: Mild coronary artery calcification, GERD, anxiety, obesity due to excess calories, hypertension (after COVID infection), former smoker, mild sleep apnea, Hx of COVID 19 infection.   She is referred to the office at the request of MFaustino Congress NP for evaluation of palpitations.  Post COVID infection patient was experiencing palpitation and was referred to cardiology for evaluation.  She underwent a 14-day extended Holter monitor which noted an average heart rate of 90 bpm and sinus tachycardia burden of approximately 11%.  The shared decision was to monitor her longitudinally without pharmacological therapy as her symptoms were slowly improving.  During prior visits patient also complained of chest pain and therefore underwent GXT and echocardiogram.  Echocardiogram noted preserved LVEF, normal diastolic pattern, and no significant valvular heart disease.  However, stress ECG noted horizontal ST depressions in inferolateral leads which did resolve within 1 minute into recovery; however, also had nonlimiting chest pain.  This year decision was to proceed with coronary CTA given her young age and continued symptoms.  Patient was noted to have mild CAC however, she was at the 90th percentile.  At the last office visit the shared decision was to implement lifestyle changes and to reevaluate her lipids in 3 months.  She now is here for  follow-up.  Since last office visit patient states that he is doing well overall.  Recent lab work from 12/10/2021 independently reviewed.  Her direct LDL is 121 mg/dL.   FUNCTIONAL STATUS: Walks around 30-45 minutes every other day.  Otherwise lives an active lifestyle.  ALLERGIES: No Known Allergies  MEDICATION LIST PRIOR TO VISIT: Current Meds  Medication Sig   albuterol (VENTOLIN HFA) 108 (90 Base) MCG/ACT inhaler Inhale 2 puffs into the lungs every 6 (six) hours as needed for wheezing or shortness of breath.   ALPRAZolam (XANAX) 0.25 MG tablet Take 0.25 mg by mouth daily as needed.   aspirin 81 MG chewable tablet Chew 81 mg by mouth daily.   atorvastatin (LIPITOR) 10 MG tablet Take 1 tablet (10 mg total) by mouth at bedtime.   Calcium-Phosphorus-Vitamin D (CALCIUM/VITAMIN D3/ADULT GUMMY PO) Take by mouth daily.   famotidine (PEPCID) 20 MG tablet Take 20 mg by mouth at bedtime.   levocetirizine (XYZAL) 5 MG tablet Take 5 mg by mouth every evening. 1/2 tab   mometasone (NASONEX) 50 MCG/ACT nasal spray Place 1 spray into the nose daily. 1 spray each nostril   Multiple Vitamin (MULTIVITAMIN) capsule Take 1 capsule by mouth daily.   NON FORMULARY Citrucell Fiber Supplement   pantoprazole (PROTONIX) 20 MG tablet Take 20 mg by mouth daily.     PAST MEDICAL HISTORY: Past Medical History:  Diagnosis Date   Coronary artery calcification    GERD (gastroesophageal reflux disease)    History of COVID-19    Sinus trouble    Sleep apnea     PAST SURGICAL HISTORY: Past Surgical History:  Procedure Laterality Date   WISDOM TOOTH EXTRACTION  FAMILY HISTORY: The patient family history includes Breast cancer in an other family member; Celiac disease in her brother; Colon cancer in an other family member; Lung cancer in her father and mother; Rheum arthritis in her mother; Scleroderma in her mother.  SOCIAL HISTORY:  The patient  reports that she quit smoking about 10 years ago. Her  smoking use included cigarettes. She has a 6.25 pack-year smoking history. She has never used smokeless tobacco. She reports current alcohol use. She reports that she does not use drugs.  REVIEW OF SYSTEMS: Review of Systems  Constitutional: Positive for malaise/fatigue. Negative for chills and fever.  HENT:  Negative for hoarse voice and nosebleeds.   Eyes:  Negative for discharge, double vision and pain.  Cardiovascular:  Negative for chest pain, claudication, dyspnea on exertion, leg swelling, near-syncope, orthopnea, palpitations, paroxysmal nocturnal dyspnea and syncope.  Respiratory:  Negative for hemoptysis and shortness of breath.   Musculoskeletal:  Negative for muscle cramps and myalgias.  Gastrointestinal:  Negative for abdominal pain, constipation, diarrhea, hematemesis, hematochezia, melena, nausea and vomiting.  Neurological:  Negative for dizziness and light-headedness.   PHYSICAL EXAM: Vitals with BMI 12/17/2021 09/30/2021 09/26/2021  Height 5' 4"  5' 4"  5' 4"   Weight 200 lbs 10 oz 198 lbs 197 lbs 10 oz  BMI 34.42 09.32 35.5  Systolic 732 202 542  Diastolic 73 70 84  Pulse 69 70 67    CONSTITUTIONAL: Well-developed and well-nourished. No acute distress.  SKIN: Skin is warm and dry. No rash noted. No cyanosis. No pallor. No jaundice HEAD: Normocephalic and atraumatic.  EYES: No scleral icterus MOUTH/THROAT: Moist oral membranes.  NECK: No JVD present. No thyromegaly noted. No carotid bruits  LYMPHATIC: No visible cervical adenopathy.  CHEST Normal respiratory effort. No intercostal retractions  LUNGS: Clear to auscultation bilaterally. No stridor. No wheezes. No rales.  CARDIOVASCULAR: Regular rate and rhythm, positive S1-S2, no murmurs rubs or gallops appreciated. ABDOMINAL: Obese, soft, nontender, nondistended, positive bowel sounds all 4 quadrants. No apparent ascites.  EXTREMITIES: No peripheral edema, +2 PT/DP bilaterally  HEMATOLOGIC: No significant  bruising NEUROLOGIC: Oriented to person, place, and time. Nonfocal. Normal muscle tone.  PSYCHIATRIC: Normal mood and affect. Normal behavior. Cooperative  CARDIAC DATABASE: EKG: 08/15/2021: Normal sinus rhythm, 60 bpm, normal axis, without underlying ischemia or injury pattern.    Echocardiogram: 08/22/2021: Normal LV systolic function with visual EF 60-65%. Left ventricle cavity is normal in size. Normal left ventricular wall thickness. Normal global wall motion. Normal diastolic filling pattern, normal LAP. No significant valvular heart disease. No prior study for comparison.  Stress Testing: Exercise treadmill stress test 08/22/2021: Functional status: Good Chest pain: Yes. Reason for stopping exercise: Fatigue/weakness Hypertensive response to exercise: No. Exercise time 9 minutes 39 seconds on Bruce protocol, achieved 11.21 METS, 99% age-predicted maximum heart rate.  Stress ECG positive for ischemia.  Intermediate risk study -given the nonlimiting anginal discomfort with exercise, stress ECG notes horizontal ST depressions in the inferolateral leads which resolved within 1 minute into recovery, Duke Treadmill score 0.65.  Heart Catheterization: None  Coronary CTA 09/16/2021: 1. Total coronary calcium score of 17.2. This was 90th percentile for age and sex matched control. 2. Normal coronary origin with right dominance. 3. CAD-RADS = 1 Minimal non-obstructive CAD. Proximal LAD minimal stenosis (0-24%) due to calcified plaque, mid to apical LAD is patent.  Otherwise left main, LCx, and RCA are patent. 4. No significant incidental noncardiac findings noted.  14 day extended Holter monitor: Dominant rhythm normal  sinus, followed by sinus tachycardia (burden 11%). Heart rate 44-193 bpm. Avg HR 90, bpm. No atrial fibrillation, ventricular tachycardia, high grade AV block, pauses (3 seconds or longer). Rare episodes of paroxysmal supraventricular tachycardia. Total ventricular  ectopic burden <1%. Total supraventricular ectopic burden <1%. Patient triggered events: 13 underlying rhythm is normal sinus without dysrhythmia.  LABORATORY DATA: External Labs: Collected: 07/26/2021 BUN 7, creatinine 0.6 Sodium 139, potassium 4.1, chloride 101, bicarb 25. AST 14, ALT 9, alkaline phosphatase 51 Hemoglobin 12.7 g/dL, hematocrit 38.6% TSH 1.22  07/20/2021: TSH 1.73  IMPRESSION:    ICD-10-CM   1. Agatston CAC score, <100  R93.1 atorvastatin (LIPITOR) 10 MG tablet    Lipid Panel With LDL/HDL Ratio    LDL cholesterol, direct    CMP14+EGFR    2. History of COVID-19  Z86.16     3. Palpitations  R00.2     4. Class 1 obesity due to excess calories without serious comorbidity with body mass index (BMI) of 33.0 to 33.9 in adult  E66.09    Z68.33     5. Former smoker  Z87.891        RECOMMENDATIONS: Jamarria Real is a 56 y.o. female whose past medical history and cardiac risk factors include: Mild coronary artery calcification, GERD, anxiety, obesity due to excess calories, hypertension (after COVID infection), former smoker, mild sleep apnea, Hx of COVID 19 infection.   Agatston CAC score, <100 Total CAC 17.2 AU, 90th percentile Minimal nonobstructive CAD in the LAD distribution.  Given her estimated 10-year risk of ASCVD, her calcium score is greater than 75 percentile for age/cohorts, LDL 121 mg/dL.  Shared decision was to start statin therapy. Atorvastatin 10 mg p.o. nightly Fasting lipid profile and CMP in 6 weeks to evaluate therapy and liver function. Start aspirin 81 mg p.o. daily Ischemic work-up reviewed as part of today's decision-making process. No additional cardiovascular testing warranted at this time.  Palpitations / History of COVID-19: Palpitations more pronounced after her COVID-19 infection. Slowly improving. Underwent extended Holter monitor which noted sinus tachycardia burden of approximately 11%. Since his symptoms are improving we  will continue to monitor.  Class 1 obesity due to excess calories without serious comorbidity with body mass index (BMI) of 33.0 to 33.9 in adult Body mass index is 34.43 kg/m. I reviewed with the patient the importance of diet, regular physical activity/exercise, weight loss.   Patient is educated on increasing physical activity gradually as tolerated.  With the goal of moderate intensity exercise for 30 minutes a day 5 days a week.  Former smoker Educated on the importance of continued smoking cessation.  FINAL MEDICATION LIST END OF ENCOUNTER: Meds ordered this encounter  Medications   atorvastatin (LIPITOR) 10 MG tablet    Sig: Take 1 tablet (10 mg total) by mouth at bedtime.    Dispense:  90 tablet    Refill:  0     There are no discontinued medications.    Current Outpatient Medications:    albuterol (VENTOLIN HFA) 108 (90 Base) MCG/ACT inhaler, Inhale 2 puffs into the lungs every 6 (six) hours as needed for wheezing or shortness of breath., Disp: 18 g, Rfl: 3   ALPRAZolam (XANAX) 0.25 MG tablet, Take 0.25 mg by mouth daily as needed., Disp: , Rfl:    aspirin 81 MG chewable tablet, Chew 81 mg by mouth daily., Disp: , Rfl:    atorvastatin (LIPITOR) 10 MG tablet, Take 1 tablet (10 mg total) by mouth at bedtime., Disp:  90 tablet, Rfl: 0   Calcium-Phosphorus-Vitamin D (CALCIUM/VITAMIN D3/ADULT GUMMY PO), Take by mouth daily., Disp: , Rfl:    famotidine (PEPCID) 20 MG tablet, Take 20 mg by mouth at bedtime., Disp: , Rfl:    levocetirizine (XYZAL) 5 MG tablet, Take 5 mg by mouth every evening. 1/2 tab, Disp: , Rfl:    mometasone (NASONEX) 50 MCG/ACT nasal spray, Place 1 spray into the nose daily. 1 spray each nostril, Disp: , Rfl:    Multiple Vitamin (MULTIVITAMIN) capsule, Take 1 capsule by mouth daily., Disp: , Rfl:    NON FORMULARY, Citrucell Fiber Supplement, Disp: , Rfl:    pantoprazole (PROTONIX) 20 MG tablet, Take 20 mg by mouth daily., Disp: , Rfl:    albuterol (VENTOLIN  HFA) 108 (90 Base) MCG/ACT inhaler, INHALE 2 PUFFS INTO THE LUNGS EVERY 6 HOURS AS NEEDED FOR WHEEZING OR SHORTNESS OF BREATH, Disp: 6.7 g, Rfl: 0  Orders Placed This Encounter  Procedures   Lipid Panel With LDL/HDL Ratio   LDL cholesterol, direct   CMP14+EGFR    There are no Patient Instructions on file for this visit.   --Continue cardiac medications as reconciled in final medication list. --Return in about 7 weeks (around 02/04/2022) for Follow up , Coronary artery calcification, Lipid. Or sooner if needed. --Continue follow-up with your primary care physician regarding the management of your other chronic comorbid conditions.  Patient's questions and concerns were addressed to her satisfaction. She voices understanding of the instructions provided during this encounter.   This note was created using a voice recognition software as a result there may be grammatical errors inadvertently enclosed that do not reflect the nature of this encounter. Every attempt is made to correct such errors.  Rex Kras, [place], [facility]  Pager: [phone] Office: [phone]

## 2021-12-21 ENCOUNTER — Encounter: Payer: Self-pay | Admitting: Cardiology

## 2022-01-03 ENCOUNTER — Other Ambulatory Visit: Payer: Self-pay

## 2022-01-03 DIAGNOSIS — R931 Abnormal findings on diagnostic imaging of heart and coronary circulation: Secondary | ICD-10-CM

## 2022-02-13 ENCOUNTER — Ambulatory Visit: Payer: 59 | Admitting: Cardiology

## 2022-08-04 ENCOUNTER — Ambulatory Visit: Payer: 59 | Admitting: Cardiology

## 2022-08-04 ENCOUNTER — Encounter: Payer: Self-pay | Admitting: Cardiology

## 2022-08-04 VITALS — BP 134/84 | HR 83 | Resp 16 | Ht 64.0 in | Wt 200.4 lb

## 2022-08-04 DIAGNOSIS — R931 Abnormal findings on diagnostic imaging of heart and coronary circulation: Secondary | ICD-10-CM

## 2022-08-04 DIAGNOSIS — Z8616 Personal history of COVID-19: Secondary | ICD-10-CM

## 2022-08-04 DIAGNOSIS — R030 Elevated blood-pressure reading, without diagnosis of hypertension: Secondary | ICD-10-CM

## 2022-08-04 DIAGNOSIS — I2510 Atherosclerotic heart disease of native coronary artery without angina pectoris: Secondary | ICD-10-CM

## 2022-08-04 DIAGNOSIS — Z87891 Personal history of nicotine dependence: Secondary | ICD-10-CM

## 2022-08-04 DIAGNOSIS — R002 Palpitations: Secondary | ICD-10-CM

## 2022-08-04 DIAGNOSIS — E6609 Other obesity due to excess calories: Secondary | ICD-10-CM

## 2022-08-04 NOTE — Progress Notes (Signed)
ID:  Ruth Roberts, DOB Apr 17, 1966, MRN [id number]  PCP:  Faustino Congress, NP  Cardiologist:  Rex Kras, DO, [facility]  (established care 08/15/2021)  Date: 08/04/22 Last Office Visit: 12/17/2021   Chief Complaint  Patient presents with   Follow-up    Elevated blood pressures and heart rate    HPI  Ruth Roberts is a 56 y.o. female whose past medical history and cardiovascular risk factors include: Mild coronary artery calcification, GERD, anxiety, obesity due to excess calories, hypertension (after COVID infection), former smoker, mild sleep apnea, Hx of COVID 19 infection.   She was referred to the practice back in September 2022 for palpitations and has undergone appropriate work-up.  Prior cardiac monitor noted average heart rate of 90 bpm and tachycardia burden of approximately 11%.  However, since the symptoms are overall improving the shared decision was to treat her medically.  She now presents for reevaluation due to elevated heart rate and blood pressure.  Patient states that about 3 weeks ago after attending a wedding started noticing congestion and tested for COVID-19 via home kits which were negative x2.  She is noticing her symptoms happening predominantly at rest, has been getting interrupted sleep, no other precipitating factors.  Very minimal caffeine intake.  No known thyroid disease. Has irregular menses as she is transitioning into menopause. She had labs recently with PCP results are still pending.    She wears an Marketing executive and during these episodes of tachycardia the underlying rhythm was reported to be sinus.  No known documented A-fib.  Patient has not been checking her blood pressures at home regularly.  However, when she has episodes of palpitations/tachycardia her blood pressure at that time have been elevated.  FUNCTIONAL STATUS: Walks around 30-45 minutes every other day.  Otherwise lives an active lifestyle.  ALLERGIES: No Known  Allergies  MEDICATION LIST PRIOR TO VISIT: Current Meds  Medication Sig   albuterol (VENTOLIN HFA) 108 (90 Base) MCG/ACT inhaler Inhale 2 puffs into the lungs every 6 (six) hours as needed for wheezing or shortness of breath.   ALPRAZolam (XANAX) 0.25 MG tablet Take 0.25 mg by mouth daily as needed.   Calcium-Phosphorus-Vitamin D (CALCIUM/VITAMIN D3/ADULT GUMMY PO) Take by mouth daily.   famotidine (PEPCID) 20 MG tablet Take 20 mg by mouth at bedtime.   fexofenadine-pseudoephedrine (ALLEGRA-D) 60-120 MG 12 hr tablet Take 1 tablet by mouth 2 (two) times daily.   mometasone (NASONEX) 50 MCG/ACT nasal spray Place 1 spray into the nose daily. 1 spray each nostril   Multiple Vitamin (MULTIVITAMIN) capsule Take 1 capsule by mouth daily.   NON FORMULARY Citrucell Fiber Supplement   pantoprazole (PROTONIX) 20 MG tablet Take 20 mg by mouth daily.     PAST MEDICAL HISTORY: Past Medical History:  Diagnosis Date   Coronary artery calcification    GERD (gastroesophageal reflux disease)    History of COVID-19    Hyperlipidemia    Sinus trouble    Sleep apnea     PAST SURGICAL HISTORY: Past Surgical History:  Procedure Laterality Date   WISDOM TOOTH EXTRACTION      FAMILY HISTORY: The patient family history includes Breast cancer in an other family member; Celiac disease in her brother; Colon cancer in an other family member; Lung cancer in her father and mother; Rheum arthritis in her mother; Scleroderma in her mother.  SOCIAL HISTORY:  The patient  reports that she quit smoking about 10 years ago. Her smoking use included cigarettes.  She has a 6.25 pack-year smoking history. She has never used smokeless tobacco. She reports current alcohol use. She reports that she does not use drugs.  REVIEW OF SYSTEMS: Review of Systems  Constitutional: Positive for malaise/fatigue. Negative for chills and fever.  HENT:  Negative for hoarse voice and nosebleeds.   Eyes:  Negative for discharge, double  vision and pain.  Cardiovascular:  Negative for chest pain, claudication, dyspnea on exertion, leg swelling, near-syncope, orthopnea, palpitations, paroxysmal nocturnal dyspnea and syncope.  Respiratory:  Negative for hemoptysis and shortness of breath.   Musculoskeletal:  Negative for muscle cramps and myalgias.  Gastrointestinal:  Negative for abdominal pain, constipation, diarrhea, hematemesis, hematochezia, melena, nausea and vomiting.  Neurological:  Negative for dizziness and light-headedness.    PHYSICAL EXAM:    08/04/2022    4:18 PM 12/17/2021   10:53 AM 09/30/2021    2:04 PM  Vitals with BMI  Height '5\' 4"'$  '5\' 4"'$  '5\' 4"'$   Weight 200 lbs 6 oz 200 lbs 10 oz 198 lbs  BMI 34.38 24.26 83.41  Systolic 962 229 798  Diastolic 84 73 70  Pulse 83 69 70    CONSTITUTIONAL: Well-developed and well-nourished. No acute distress.  SKIN: Skin is warm and dry. No rash noted. No cyanosis. No pallor. No jaundice HEAD: Normocephalic and atraumatic.  EYES: No scleral icterus MOUTH/THROAT: Moist oral membranes.  NECK: No JVD present. No thyromegaly noted. No carotid bruits  CHEST Normal respiratory effort. No intercostal retractions  LUNGS: Clear to auscultation bilaterally. No stridor. No wheezes. No rales.  CARDIOVASCULAR: Regular rate and rhythm, positive S1-S2, no murmurs rubs or gallops appreciated. ABDOMINAL: Obese, soft, nontender, nondistended, positive bowel sounds all 4 quadrants. No apparent ascites.  EXTREMITIES: No peripheral edema, +2 PT/DP bilaterally  HEMATOLOGIC: No significant bruising NEUROLOGIC: Oriented to person, place, and time. Nonfocal. Normal muscle tone.  PSYCHIATRIC: Normal mood and affect. Normal behavior. Cooperative  CARDIAC DATABASE: EKG: 08/04/2022: Sinus Rhythm, 72bpm, normal axis, without underlying injury pattern or ischemia.   Echocardiogram: 08/22/2021: Normal LV systolic function with visual EF 60-65%. Left ventricle cavity is normal in size. Normal left  ventricular wall thickness. Normal global wall motion. Normal diastolic filling pattern, normal LAP. No significant valvular heart disease. No prior study for comparison.  Stress Testing: Exercise treadmill stress test 08/22/2021: Functional status: Good Chest pain: Yes. Reason for stopping exercise: Fatigue/weakness Hypertensive response to exercise: No. Exercise time 9 minutes 39 seconds on Bruce protocol, achieved 11.21 METS, 99% APMHR.  Stress ECG positive for ischemia.  Intermediate risk study -given the nonlimiting anginal discomfort with exercise, stress ECG notes horizontal ST depressions in the inferolateral leads which resolved within 1 minute into recovery, Duke Treadmill score 0.65.  Heart Catheterization: None  Coronary CTA 09/16/2021: 1. Total coronary calcium score of 17.2. This was 90th percentile for age and sex matched control. 2. Normal coronary origin with right dominance. 3. CAD-RADS = 1 Minimal non-obstructive CAD. Proximal LAD minimal stenosis (0-24%) due to calcified plaque, mid to apical LAD is patent.  Otherwise left main, LCx, and RCA are patent. 4. No significant incidental noncardiac findings noted.  14 day extended Holter monitor: Dominant rhythm normal sinus, followed by sinus tachycardia (burden 11%). Heart rate 44-193 bpm. Avg HR 90, bpm. No atrial fibrillation, ventricular tachycardia, high grade AV block, pauses (3 seconds or longer). Rare episodes of paroxysmal supraventricular tachycardia. Total ventricular ectopic burden <1%. Total supraventricular ectopic burden <1%. Patient triggered events: 13 underlying rhythm is normal sinus without dysrhythmia.  LABORATORY DATA: External Labs: Collected: 07/26/2021 BUN 7, creatinine 0.6 Sodium 139, potassium 4.1, chloride 101, bicarb 25. AST 14, ALT 9, alkaline phosphatase 51 Hemoglobin 12.7 g/dL, hematocrit 38.6% TSH 1.22  07/20/2021: TSH 1.73  IMPRESSION:    ICD-10-CM   1. Blood pressure  elevated without history of HTN  R03.0     2. Palpitations  R00.2     3. Coronary artery calcification  I25.10 EKG 12-Lead   I25.84     4. Agatston CAC score, <100  R93.1     5. History of COVID-19  Z86.16     6. Class 1 obesity due to excess calories without serious comorbidity with body mass index (BMI) of 33.0 to 33.9 in adult  E66.09    Z68.33     7. Former smoker  Z87.891        RECOMMENDATIONS: Ruth Roberts is a 56 y.o. female whose past medical history and cardiac risk factors include: Mild coronary artery calcification, GERD, anxiety, obesity due to excess calories, hypertension (after COVID infection), former smoker, mild sleep apnea, Hx of COVID 19 infection.   Blood pressure elevated without history of HTN Patient has not been checking her blood pressures on a regular basis; however, checks it when she has episodes of tachycardia/palpitations and at that time is usually elevated. I have encouraged her to check blood pressures twice a day around the same time and keep a log and bring it in to her next office visit with either myself or PCP. We emphasized the importance of a low-salt diet Encouraged her to increase physical activity as tolerated with a goal of 30 minutes a day 5 days a week.  Palpitations Reviewed her data from the [facility]. No documented atrial fibrillation. Her resting heart rate is well controlled. But she does have episodes of tachycardia usually less than 150 bpm. She recently had labs with PCP request records to reevaluate her electrolytes and thyroid function. Underlying anemia less likely as she is having very irregular menses. I suspect a component of her palpitations could be the transition into menopause. We discussed repeating cardiac monitor for further evaluation; however, shared decision was to continue monitoring her rhythm via her Apple iWatch.  We discussed pharmacological therapy she would like to reconsider this at this  time.  Coronary artery calcification / Agatston CAC score, <100 Total CAC 17.2 AU, 90th percentile. No known history of elevated 10-year risk of ASCVD and her last LDL was 121 mg/dL. Was prescribed aspirin and low-dose statin therapy-the patient shows not to initiate it. Ischemic work-up as outlined above No additional testing warranted at this time  Class 1 obesity due to excess calories without serious comorbidity with body mass index (BMI) of 33.0 to 33.9 in adult Body mass index is 34.4 kg/m. I reviewed with the patient the importance of diet, regular physical activity/exercise, weight loss.   Patient is educated on increasing physical activity gradually as tolerated.  With the goal of moderate intensity exercise for 30 minutes a day 5 days a week.  FINAL MEDICATION LIST END OF ENCOUNTER: No orders of the defined types were placed in this encounter.    Medications Discontinued During This Encounter  Medication Reason   aspirin 81 MG chewable tablet    atorvastatin (LIPITOR) 10 MG tablet    levocetirizine (XYZAL) 5 MG tablet    albuterol (VENTOLIN HFA) 108 (90 Base) MCG/ACT inhaler       Current Outpatient Medications:    albuterol (VENTOLIN HFA)  108 (90 Base) MCG/ACT inhaler, Inhale 2 puffs into the lungs every 6 (six) hours as needed for wheezing or shortness of breath., Disp: 18 g, Rfl: 3   ALPRAZolam (XANAX) 0.25 MG tablet, Take 0.25 mg by mouth daily as needed., Disp: , Rfl:    Calcium-Phosphorus-Vitamin D (CALCIUM/VITAMIN D3/ADULT GUMMY PO), Take by mouth daily., Disp: , Rfl:    famotidine (PEPCID) 20 MG tablet, Take 20 mg by mouth at bedtime., Disp: , Rfl:    fexofenadine-pseudoephedrine (ALLEGRA-D) 60-120 MG 12 hr tablet, Take 1 tablet by mouth 2 (two) times daily., Disp: , Rfl:    mometasone (NASONEX) 50 MCG/ACT nasal spray, Place 1 spray into the nose daily. 1 spray each nostril, Disp: , Rfl:    Multiple Vitamin (MULTIVITAMIN) capsule, Take 1 capsule by mouth daily.,  Disp: , Rfl:    NON FORMULARY, Citrucell Fiber Supplement, Disp: , Rfl:    pantoprazole (PROTONIX) 20 MG tablet, Take 20 mg by mouth daily., Disp: , Rfl:   Orders Placed This Encounter  Procedures   EKG 12-Lead    There are no Patient Instructions on file for this visit.   --Continue cardiac medications as reconciled in final medication list. --Return in about 4 weeks (around 09/01/2022) for Follow up palpitations & BP . Or sooner if needed. --Continue follow-up with your primary care physician regarding the management of your other chronic comorbid conditions.  Patient's questions and concerns were addressed to her satisfaction. She voices understanding of the instructions provided during this encounter.   This note was created using a voice recognition software as a result there may be grammatical errors inadvertently enclosed that do not reflect the nature of this encounter. Every attempt is made to correct such errors.  Rex Kras, [place], [facility]  Pager: [phone] Office: [phone]

## 2022-09-01 ENCOUNTER — Encounter: Payer: Self-pay | Admitting: Cardiology

## 2022-09-01 ENCOUNTER — Ambulatory Visit: Payer: Commercial Managed Care - HMO | Admitting: Cardiology

## 2022-09-01 VITALS — BP 123/79 | HR 75 | Temp 97.7°F | Resp 16 | Ht 64.0 in | Wt 198.0 lb

## 2022-09-01 DIAGNOSIS — R931 Abnormal findings on diagnostic imaging of heart and coronary circulation: Secondary | ICD-10-CM

## 2022-09-01 DIAGNOSIS — R002 Palpitations: Secondary | ICD-10-CM

## 2022-09-01 DIAGNOSIS — I2510 Atherosclerotic heart disease of native coronary artery without angina pectoris: Secondary | ICD-10-CM

## 2022-09-01 DIAGNOSIS — E6609 Other obesity due to excess calories: Secondary | ICD-10-CM

## 2022-09-01 DIAGNOSIS — R030 Elevated blood-pressure reading, without diagnosis of hypertension: Secondary | ICD-10-CM

## 2022-09-01 NOTE — Progress Notes (Signed)
ID:  Ruth Roberts, DOB 05/25/1966, MRN [id number]  PCP:  Fanny Bien, MD  Cardiologist:  Rex Kras, DO, [facility]  (established care 08/15/2021)  Date: 09/01/22 Last Office Visit: 08/04/2022  Chief Complaint  Patient presents with   Palpitations   Hypertension   Follow-up    HPI  Ruth Roberts is a 56 y.o. female whose past medical history and cardiovascular risk factors include: Mild coronary artery calcification, GERD, anxiety, obesity due to excess calories, hypertension (after COVID infection), former smoker, mild sleep apnea, Hx of COVID 19 infection.   Patient initially presented to the office back in September 2022 for evaluation of palpitations and to undergo cardiovascular work-up.  She has undergone very thorough cardiovascular work-up with presented to the office and came to the office sooner than scheduled visit back in August 2023 for evaluation and management of palpitations.  Recommended repeating a Zio patch to evaluate for underlying dysrhythmias; however, the shared decision was to monitor symptoms with apple iWatch.  She has documented lifestyle changes since last office visit has not had any significant amount of palpitations.  Patient states that her home blood pressure ranges less than 135 mmHg and pulse is between 60-70 bpm.  She is more conscientious of her salt intake.  Has been evaluated for sleep apnea in the past, currently uses an oral appliance.  She has been walking at least 2 to 3 miles per day 6 days a week and intermittently using her elliptical.  Given her mild coronary artery calcification past that she has refused to be on aspirin and statin therapy.  She is focused on lifestyle changes and outside labs independently reviewed which notes improvement in LDL.  FUNCTIONAL STATUS: Walks around 30-45 minutes every other day.  Otherwise lives an active lifestyle.  ALLERGIES: No Known Allergies  MEDICATION LIST PRIOR TO VISIT: Current Meds   Medication Sig   albuterol (VENTOLIN HFA) 108 (90 Base) MCG/ACT inhaler Inhale 2 puffs into the lungs every 6 (six) hours as needed for wheezing or shortness of breath.   ALPRAZolam (XANAX) 0.25 MG tablet Take 0.25 mg by mouth daily as needed.   azelastine (ASTELIN) 0.1 % nasal spray SMARTSIG:1-2 Spray(s) Both Nares 1-2 Times Daily   Calcium-Phosphorus-Vitamin D (CALCIUM/VITAMIN D3/ADULT GUMMY PO) Take by mouth daily.   famotidine (PEPCID) 20 MG tablet Take 20 mg by mouth at bedtime.   fexofenadine-pseudoephedrine (ALLEGRA-D) 60-120 MG 12 hr tablet Take 1 tablet by mouth 2 (two) times daily.   Multiple Vitamin (MULTIVITAMIN) capsule Take 1 capsule by mouth daily.   NON FORMULARY Citrucell Fiber Supplement   pantoprazole (PROTONIX) 20 MG tablet Take 20 mg by mouth daily.     PAST MEDICAL HISTORY: Past Medical History:  Diagnosis Date   Coronary artery calcification    GERD (gastroesophageal reflux disease)    History of COVID-19    Hyperlipidemia    Sinus trouble    Sleep apnea     PAST SURGICAL HISTORY: Past Surgical History:  Procedure Laterality Date   WISDOM TOOTH EXTRACTION      FAMILY HISTORY: The patient family history includes Breast cancer in an other family member; Celiac disease in her brother; Colon cancer in an other family member; Lung cancer in her father and mother; Rheum arthritis in her mother; Scleroderma in her mother.  SOCIAL HISTORY:  The patient  reports that she quit smoking about 10 years ago. Her smoking use included cigarettes. She has a 6.25 pack-year smoking history. She has  never used smokeless tobacco. She reports current alcohol use. She reports that she does not use drugs.  REVIEW OF SYSTEMS: Review of Systems  Constitutional: Positive for malaise/fatigue (better.). Negative for chills and fever.  HENT:  Negative for hoarse voice and nosebleeds.   Eyes:  Negative for discharge, double vision and pain.  Cardiovascular:  Negative for chest pain,  claudication, dyspnea on exertion, leg swelling, near-syncope, orthopnea, palpitations, paroxysmal nocturnal dyspnea and syncope.  Respiratory:  Negative for hemoptysis and shortness of breath.   Musculoskeletal:  Negative for muscle cramps and myalgias.  Gastrointestinal:  Negative for abdominal pain, constipation, diarrhea, hematemesis, hematochezia, melena, nausea and vomiting.  Neurological:  Negative for dizziness and light-headedness.    PHYSICAL EXAM:    09/01/2022    3:20 PM 08/04/2022    4:18 PM 12/17/2021   10:53 AM  Vitals with BMI  Height '5\' 4"'$  '5\' 4"'$  '5\' 4"'$   Weight 198 lbs 200 lbs 6 oz 200 lbs 10 oz  BMI 33.97 28.36 62.94  Systolic 765 465 035  Diastolic 79 84 73  Pulse 75 83 69    CONSTITUTIONAL: Well-developed and well-nourished. No acute distress.  SKIN: Skin is warm and dry. No rash noted. No cyanosis. No pallor. No jaundice HEAD: Normocephalic and atraumatic.  EYES: No scleral icterus MOUTH/THROAT: Moist oral membranes.  NECK: No JVD present. No thyromegaly noted. No carotid bruits  CHEST Normal respiratory effort. No intercostal retractions  LUNGS: Clear to auscultation bilaterally. No stridor. No wheezes. No rales.  CARDIOVASCULAR: Regular rate and rhythm, positive S1-S2, no murmurs rubs or gallops appreciated. ABDOMINAL: Obese, soft, nontender, nondistended, positive bowel sounds all 4 quadrants. No apparent ascites.  EXTREMITIES: No peripheral edema, +2 PT/DP bilaterally  HEMATOLOGIC: No significant bruising NEUROLOGIC: Oriented to person, place, and time. Nonfocal. Normal muscle tone.  PSYCHIATRIC: Normal mood and affect. Normal behavior. Cooperative  CARDIAC DATABASE: EKG: 08/04/2022: Sinus Rhythm, 72bpm, normal axis, without underlying injury pattern or ischemia.   Echocardiogram: 08/22/2021: Normal LV systolic function with visual EF 60-65%. Left ventricle cavity is normal in size. Normal left ventricular wall thickness. Normal global wall motion. Normal  diastolic filling pattern, normal LAP. No significant valvular heart disease. No prior study for comparison.  Stress Testing: Exercise treadmill stress test 08/22/2021: Functional status: Good Chest pain: Yes. Reason for stopping exercise: Fatigue/weakness Hypertensive response to exercise: No. Exercise time 9 minutes 39 seconds on Bruce protocol, achieved 11.21 METS, 99% APMHR.  Stress ECG positive for ischemia.  Intermediate risk study -given the nonlimiting anginal discomfort with exercise, stress ECG notes horizontal ST depressions in the inferolateral leads which resolved within 1 minute into recovery, Duke Treadmill score 0.65.  Heart Catheterization: None  Coronary CTA 09/16/2021: 1. Total coronary calcium score of 17.2. This was 90th percentile for age and sex matched control. 2. Normal coronary origin with right dominance. 3. CAD-RADS = 1 Minimal non-obstructive CAD. Proximal LAD minimal stenosis (0-24%) due to calcified plaque, mid to apical LAD is patent.  Otherwise left main, LCx, and RCA are patent. 4. No significant incidental noncardiac findings noted.  14 day extended Holter monitor: Dominant rhythm normal sinus, followed by sinus tachycardia (burden 11%). Heart rate 44-193 bpm. Avg HR 90, bpm. No atrial fibrillation, ventricular tachycardia, high grade AV block, pauses (3 seconds or longer). Rare episodes of paroxysmal supraventricular tachycardia. Total ventricular ectopic burden <1%. Total supraventricular ectopic burden <1%. Patient triggered events: 13 underlying rhythm is normal sinus without dysrhythmia.  LABORATORY DATA: External Labs: Collected: 07/26/2021 BUN  7, creatinine 0.6 Sodium 139, potassium 4.1, chloride 101, bicarb 25. AST 14, ALT 9, alkaline phosphatase 51 Hemoglobin 12.7 g/dL, hematocrit 38.6% TSH 1.22  07/20/2021: TSH 1.73  External Labs: Collected: 08/02/2022 provided by the patient performed by PCP. Total cholesterol 178,  triglycerides 52, LDL 104, HDL 64. TSH 1.89. Sodium 140, potassium 3.9, chloride 104, bicarb 28, BUN 10, creatinine 0.6. AST 24, ALT 26, alkaline phosphatase 63. Hemoglobin 13.6 g/dL, hematocrit 40.6%   IMPRESSION:    ICD-10-CM   1. Blood pressure elevated without history of HTN  R03.0     2. Palpitations  R00.2     3. Coronary artery calcification  I25.10    I25.84     4. Agatston CAC score, <100  R93.1     5. Class 1 obesity due to excess calories without serious comorbidity with body mass index (BMI) of 33.0 to 33.9 in adult  E66.09    Z68.33        RECOMMENDATIONS: Ruth Roberts is a 56 y.o. female whose past medical history and cardiac risk factors include: Mild coronary artery calcification, GERD, anxiety, obesity due to excess calories, hypertension (after COVID infection), former smoker, mild sleep apnea, Hx of COVID 19 infection.   Blood pressure elevated without history of HTN Based on her blood pressure log she has stage I hypertension. She has been hesitant with antihypertensive medications and therefore recommended continuing low-salt diet, weight loss, and improving her modifiable cardiovascular risk factors. Encouraged her to keep a log of her blood pressures and if her systolic blood pressures are consistently greater than 130-140 mmHg medical therapy could be considered  Palpitations Improved since last office visit. Continues to monitor her pulse and rhythm via her iWatch. Monitor for now  Coronary artery calcification / Agatston CAC score, <100 Total CAC 17.2 AU, 90th percentile With lifestyle changes her LDL levels have improved compared to prior 121 mg/dL to 104 mg/dL. Reluctant to be on aspirin and statin therapy. Has undergone ischemic work-up as outlined above. Monitor for now  Class 1 obesity due to excess calories without serious comorbidity with body mass index (BMI) of 33.0 to 33.9 in adult Body mass index is 33.99 kg/m. Has lost 2 pounds  since last office visit due to lifestyle changes.   I have asked her to discuss weight loss management/dietitian with PCP to help with her weight loss journey. I reviewed with the patient the importance of diet, regular physical activity/exercise, weight loss.   Patient is educated on increasing physical activity gradually as tolerated.  With the goal of moderate intensity exercise for 30 minutes a day 5 days a week.  FINAL MEDICATION LIST END OF ENCOUNTER: No orders of the defined types were placed in this encounter.    Medications Discontinued During This Encounter  Medication Reason   mometasone (NASONEX) 50 MCG/ACT nasal spray Change in therapy      Current Outpatient Medications:    albuterol (VENTOLIN HFA) 108 (90 Base) MCG/ACT inhaler, Inhale 2 puffs into the lungs every 6 (six) hours as needed for wheezing or shortness of breath., Disp: 18 g, Rfl: 3   ALPRAZolam (XANAX) 0.25 MG tablet, Take 0.25 mg by mouth daily as needed., Disp: , Rfl:    azelastine (ASTELIN) 0.1 % nasal spray, SMARTSIG:1-2 Spray(s) Both Nares 1-2 Times Daily, Disp: , Rfl:    Calcium-Phosphorus-Vitamin D (CALCIUM/VITAMIN D3/ADULT GUMMY PO), Take by mouth daily., Disp: , Rfl:    famotidine (PEPCID) 20 MG tablet, Take 20 mg by  mouth at bedtime., Disp: , Rfl:    fexofenadine-pseudoephedrine (ALLEGRA-D) 60-120 MG 12 hr tablet, Take 1 tablet by mouth 2 (two) times daily., Disp: , Rfl:    Multiple Vitamin (MULTIVITAMIN) capsule, Take 1 capsule by mouth daily., Disp: , Rfl:    NON FORMULARY, Citrucell Fiber Supplement, Disp: , Rfl:    pantoprazole (PROTONIX) 20 MG tablet, Take 20 mg by mouth daily., Disp: , Rfl:   No orders of the defined types were placed in this encounter.   There are no Patient Instructions on file for this visit.   --Continue cardiac medications as reconciled in final medication list. --Return in about 6 months (around 03/02/2023) for Follow up, Coronary artery calcification. Or sooner if  needed. --Continue follow-up with your primary care physician regarding the management of your other chronic comorbid conditions.  Patient's questions and concerns were addressed to her satisfaction. She voices understanding of the instructions provided during this encounter.   This note was created using a voice recognition software as a result there may be grammatical errors inadvertently enclosed that do not reflect the nature of this encounter. Every attempt is made to correct such errors.  Rex Kras, [place], [facility]  Pager: [phone] Office: [phone]

## 2022-10-03 ENCOUNTER — Ambulatory Visit (INDEPENDENT_AMBULATORY_CARE_PROVIDER_SITE_OTHER): Payer: 59 | Admitting: Pulmonary Disease

## 2022-10-03 ENCOUNTER — Encounter: Payer: Self-pay | Admitting: Pulmonary Disease

## 2022-10-03 VITALS — BP 120/76 | HR 90 | Temp 97.6°F | Ht 64.0 in | Wt 198.2 lb

## 2022-10-03 DIAGNOSIS — G4733 Obstructive sleep apnea (adult) (pediatric): Secondary | ICD-10-CM

## 2022-10-03 DIAGNOSIS — J3089 Other allergic rhinitis: Secondary | ICD-10-CM

## 2022-10-03 MED ORDER — ALBUTEROL SULFATE HFA 108 (90 BASE) MCG/ACT IN AERS
2.0000 | INHALATION_SPRAY | Freq: Four times a day (QID) | RESPIRATORY_TRACT | 3 refills | Status: DC | PRN
Start: 2022-10-03 — End: 2023-11-06

## 2022-10-03 MED ORDER — MONTELUKAST SODIUM 10 MG PO TABS: 10.0000 mg | ORAL_TABLET | Freq: Every day | ORAL | 6 refills | Status: DC

## 2022-10-03 MED ORDER — AZELASTINE-FLUTICASONE 137-50 MCG/ACT NA SUSP: 2.0000 | Freq: Every day | NASAL | Status: DC

## 2022-10-03 NOTE — Progress Notes (Signed)
Ruth Roberts    [id number]    [birth year]  Primary Care Physician:Dewey, Mechele Claude, MD  Referring Physician: Fanny Bien, MD [address] [place],  [place] [address]  Chief complaint:   History of mild sleep apnea on oral device Nasal stuffiness and congestion contributing to nonrestorative sleep  HPI:  Patient with mild sleep apnea who did not tolerate CPAP therapy, started on oral device and symptoms did improve with use of oral device  Has been having more nasal congestion and stuffiness for which she usually uses Astelin, has had more difficulty in the last month  Waking up a lot more, sleep is nonrestorative Continues to use her oral device  Symptoms appear to be related to exacerbation of her chronic rhinitis  Reformed social smoker  Outpatient Encounter Medications as of 10/03/2022  Medication Sig   ALPRAZolam (XANAX) 0.25 MG tablet Take 0.25 mg by mouth daily as needed.   azelastine (ASTELIN) 0.1 % nasal spray SMARTSIG:1-2 Spray(s) Both Nares 1-2 Times Daily   Azelastine-Fluticasone 137-50 MCG/ACT SUSP Place 2 sprays into the nose daily.   Calcium-Phosphorus-Vitamin D (CALCIUM/VITAMIN D3/ADULT GUMMY PO) Take by mouth daily.   celecoxib (CELEBREX) 200 MG capsule Take 1 capsule every day by oral route as needed for 30 days.   famotidine (PEPCID) 20 MG tablet Take 20 mg by mouth at bedtime.   fexofenadine-pseudoephedrine (ALLEGRA-D) 60-120 MG 12 hr tablet Take 1 tablet by mouth 2 (two) times daily.   montelukast (SINGULAIR) 10 MG tablet Take 1 tablet (10 mg total) by mouth at bedtime.   Multiple Vitamin (MULTIVITAMIN) capsule Take 1 capsule by mouth daily.   NON FORMULARY Citrucell Fiber Supplement   pantoprazole (PROTONIX) 20 MG tablet Take 20 mg by mouth daily.   [DISCONTINUED] albuterol (VENTOLIN HFA) 108 (90 Base) MCG/ACT inhaler Inhale 2 puffs into the lungs every 6 (six) hours as needed for wheezing or shortness of breath.   albuterol  (VENTOLIN HFA) 108 (90 Base) MCG/ACT inhaler Inhale 2 puffs into the lungs every 6 (six) hours as needed for wheezing or shortness of breath.   No facility-administered encounter medications on file as of 10/03/2022.    Allergies as of 10/03/2022   (No Known Allergies)    Past Medical History:  Diagnosis Date   Coronary artery calcification    GERD (gastroesophageal reflux disease)    History of COVID-19    Hyperlipidemia    Sinus trouble    Sleep apnea     Past Surgical History:  Procedure Laterality Date   WISDOM TOOTH EXTRACTION      Family History  Problem Relation Age of Onset   Lung cancer Mother    Rheum arthritis Mother    Scleroderma Mother    Lung cancer Father    Celiac disease Brother    Breast cancer Other    Colon cancer Other     Social History   Socioeconomic History   Marital status: Single    Spouse name: Not on file   Number of children: Not on file   Years of education: Not on file   Highest education level: Not on file  Occupational History   Not on file  Tobacco Use   Smoking status: Former    Packs/day: 0.25    Years: 25.00    Total pack years: 6.25    Types: Cigarettes    Quit date: 2013    Years since quitting: 10.8   Smokeless tobacco:  Never   Tobacco comments:    social   Vaping Use   Vaping Use: Never used  Substance and Sexual Activity   Alcohol use: Yes    Comment: rare   Drug use: No   Sexual activity: Not on file  Other Topics Concern   Not on file  Social History Narrative   Patient is single and lives at home alone. Patient works at a [employer]. Patient has a high school education.   Social Determinants of Health   Financial Resource Strain: Not on file  Food Insecurity: Not on file  Transportation Needs: Not on file  Physical Activity: Not on file  Stress: Not on file  Social Connections: Not on file  Intimate Partner Violence: Not on file    Review of Systems  HENT:  Positive for postnasal drip and  rhinorrhea.   Respiratory:  Positive for apnea.   Psychiatric/Behavioral:  Positive for sleep disturbance.     Vitals:   10/03/22 1140  BP: 120/76  Pulse: 90  Temp: 97.6 F (36.4 C)  SpO2: 99%     Physical Exam Constitutional:      Appearance: Normal appearance.  HENT:     Head: Normocephalic.     Mouth/Throat:     Mouth: Mucous membranes are moist.  Cardiovascular:     Rate and Rhythm: Normal rate and regular rhythm.     Heart sounds: No murmur heard.    No friction rub.  Pulmonary:     Effort: No respiratory distress.     Breath sounds: No stridor. No wheezing or rhonchi.  Musculoskeletal:     Cervical back: No rigidity or tenderness.  Neurological:     Mental Status: She is alert.  Psychiatric:        Mood and Affect: Mood normal.    Data Reviewed: Last sleep study was 08/31/2019 showing mild obstructive sleep apnea with AHI of 5  Assessment:  Mild obstructive sleep apnea  Chronic rhinitis  Mild intermittent asthma  -Exacerbation of symptoms -Nonrestorative sleep secondary to exacerbation of rhinitis symptoms  Plan/Recommendations: Change from Astelin to [place] -She does have some concerns with a history of borderline glaucoma -Only to use Dymista for 3 to 4 weeks and discontinue  Prescription for Singulair sent into pharmacy  Albuterol renewed  We can consider repeating sleep study if despite controlling rhinitis symptoms, still has significant nonrestorative sleep  Encouraged to give [place] a call if any concerns  We will keep yearly follow-ups  Sherrilyn Rist MD [facility] 10/03/2022, 12:00 PM  CC: Fanny Bien, MD

## 2022-10-03 NOTE — Progress Notes (Deleted)
dy

## 2022-10-03 NOTE — Patient Instructions (Signed)
Dymista in place of Astelin  Prescription for Singulair  Albuterol to be used as needed for shortness of breath/wheezing  Yes you may call if despite all above, still having significant issues with your sleep We can repeat a sleep study to see if there is any significant change in severity of sleep apnea  Call with significant concerns  Follow-up in a year

## 2022-10-06 IMAGING — MG MM DIGITAL SCREENING BILAT W/ TOMO AND CAD
8 series · 8 of 24 positions shown · non-contrast
Comparison: Previous exam(s).

CLINICAL DATA: Screening.

EXAM:
DIGITAL SCREENING BILATERAL MAMMOGRAM WITH TOMOSYNTHESIS AND CAD
TECHNIQUE: Bilateral screening digital craniocaudal and mediolateral oblique
mammograms were obtained. Bilateral screening digital breast
tomosynthesis was performed. The images were evaluated with
computer-aided detection.

[L CC synth-2D]
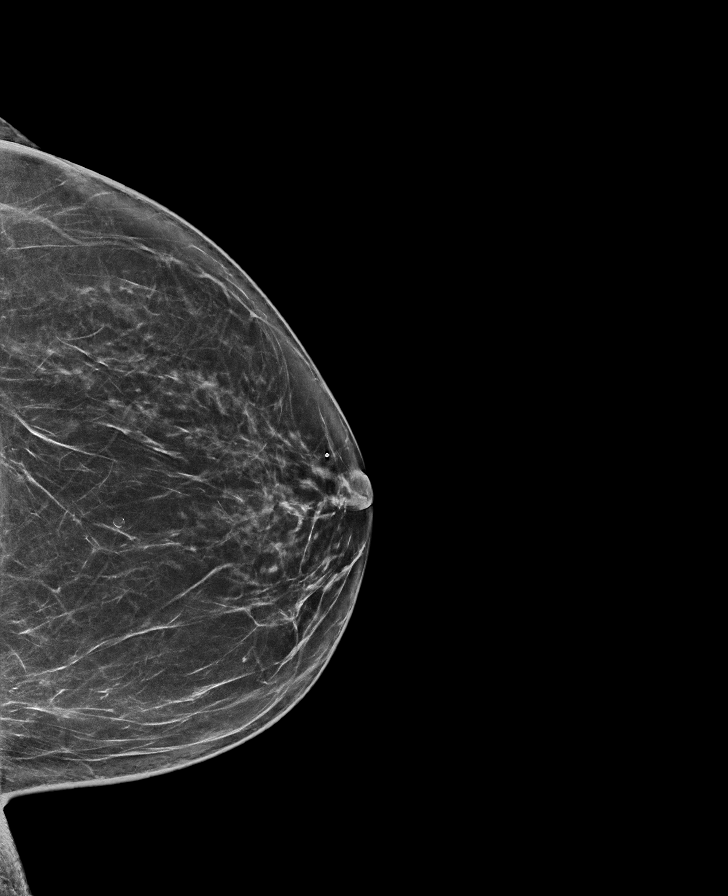

[R CC synth-2D]
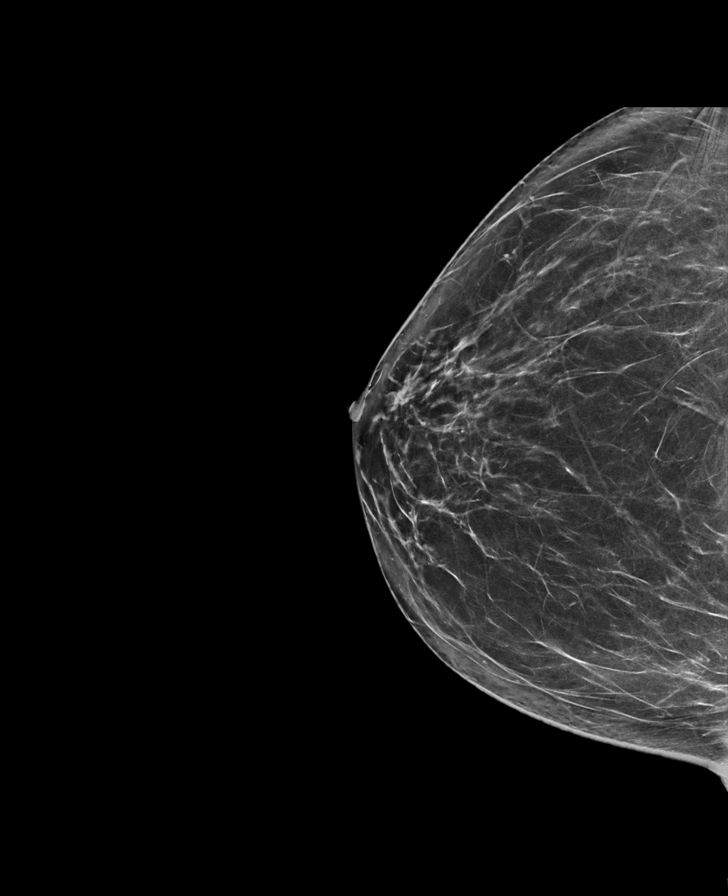

[R MLO synth-2D]
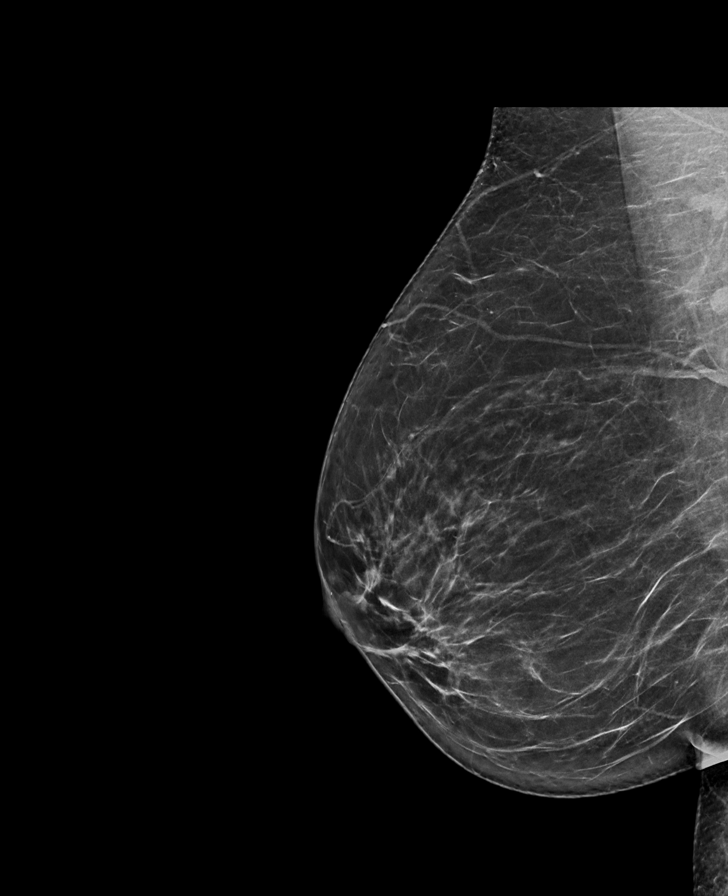

[L MLO synth-2D]
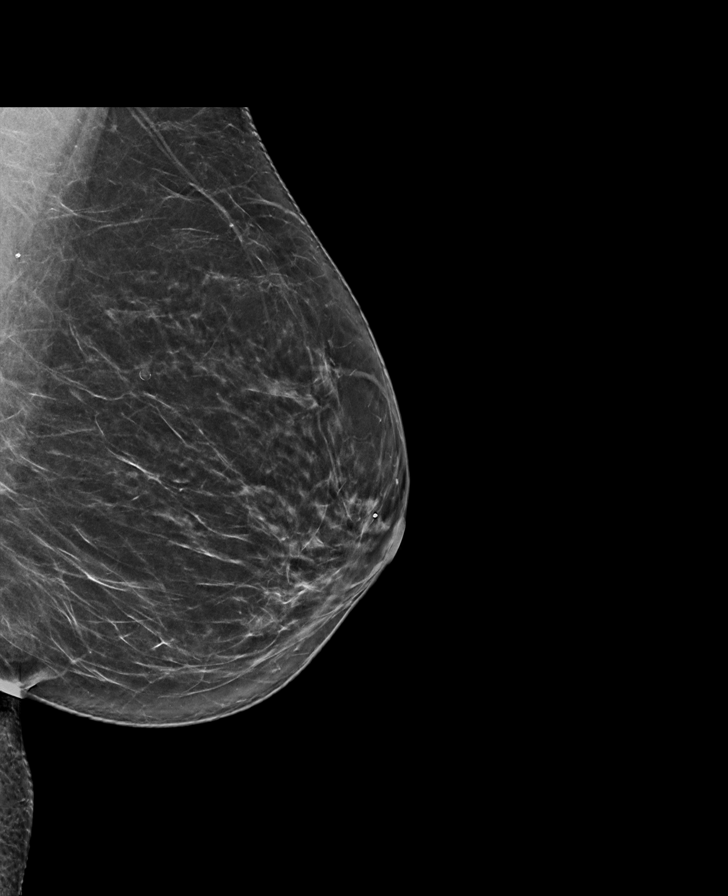

[R CC tomo · tomo slice 37/73.0]
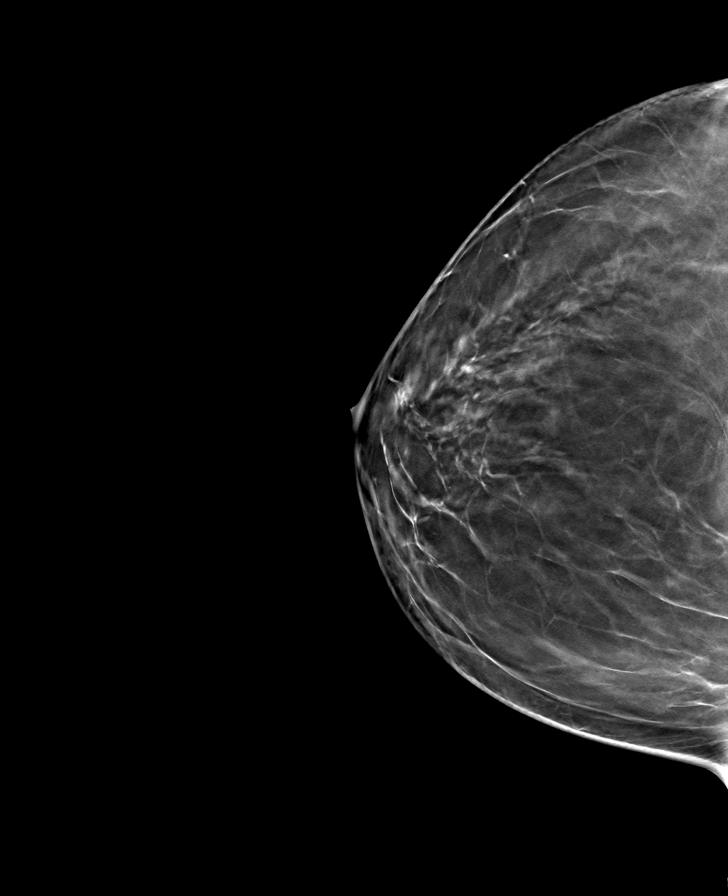

[L MLO tomo · tomo slice 37/72.0]
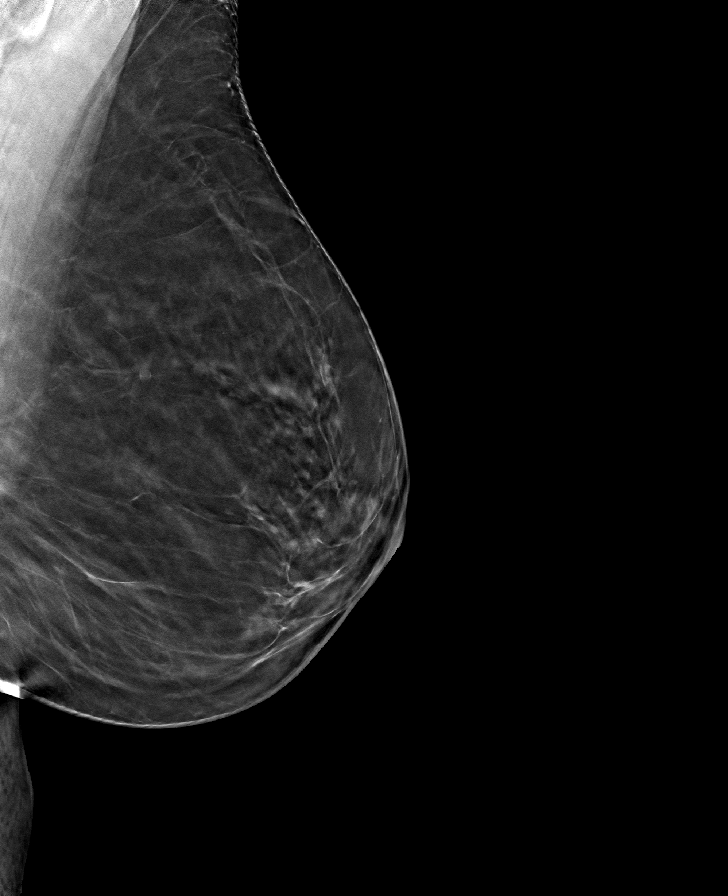

[L CC tomo · tomo slice 38/75.0]
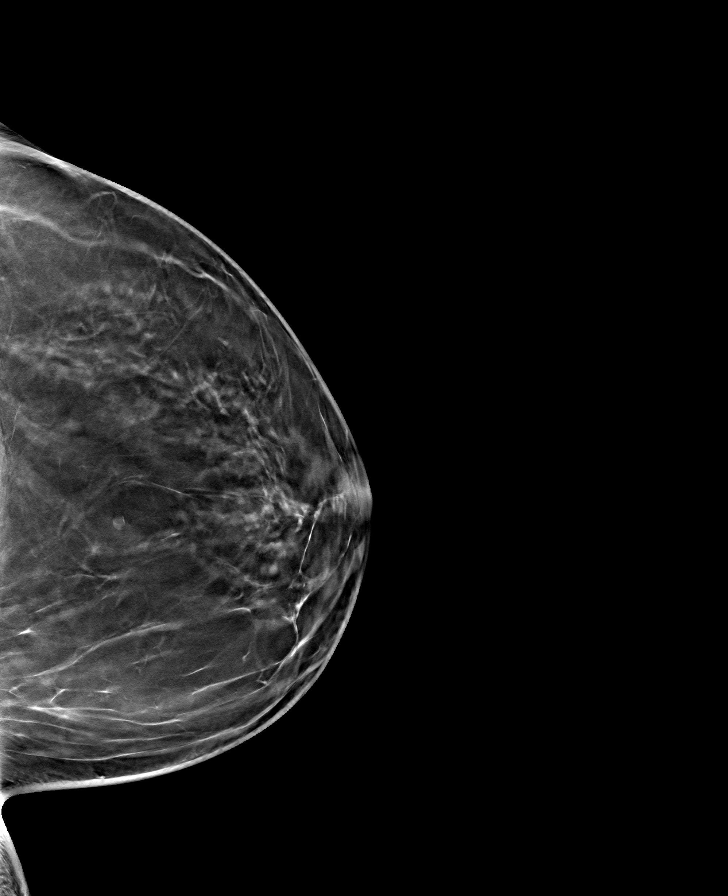

[R MLO tomo · tomo slice 37/73.0]
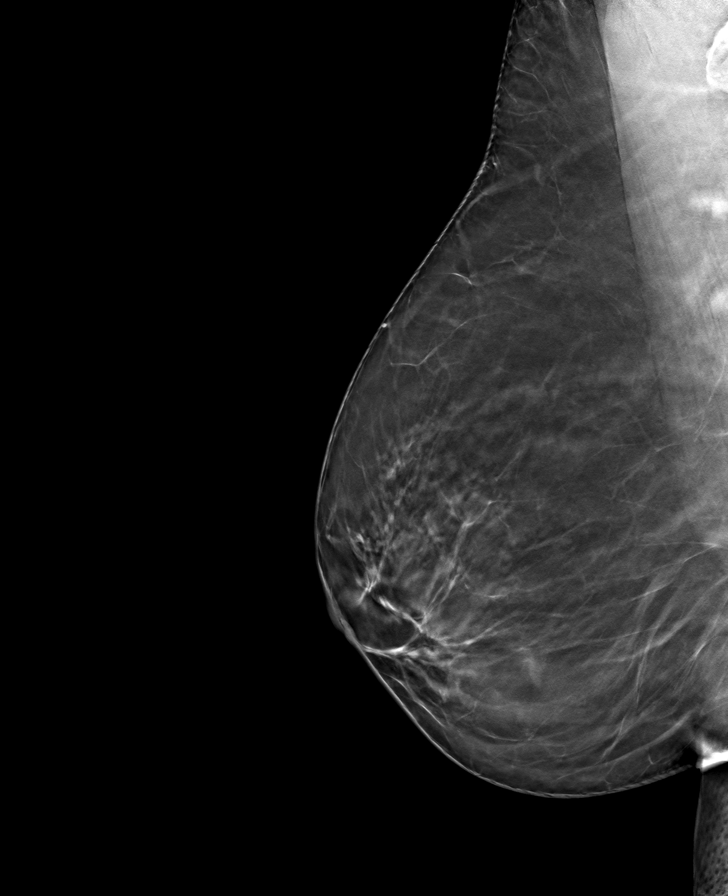

[8 of 24 positions shown; findings below may reference images not displayed]

ACR Breast Density Category b: There are scattered areas of
fibroglandular density.
FINDINGS: There are no findings suspicious for malignancy.
IMPRESSION: No mammographic evidence of malignancy. A result letter of this
screening mammogram will be mailed directly to the patient.

RECOMMENDATION:
Screening mammogram in one year. (Code:51-O-LD2)

BI-RADS CATEGORY  1: Negative.

## 2022-12-22 ENCOUNTER — Ambulatory Visit: Payer: Commercial Managed Care - HMO | Admitting: Cardiology

## 2023-03-05 ENCOUNTER — Ambulatory Visit: Payer: Commercial Managed Care - HMO | Admitting: Cardiology

## 2023-03-05 ENCOUNTER — Encounter: Payer: Self-pay | Admitting: Cardiology

## 2023-03-05 VITALS — BP 132/85 | HR 67 | Resp 17 | Ht 64.0 in | Wt 196.2 lb

## 2023-03-05 DIAGNOSIS — R030 Elevated blood-pressure reading, without diagnosis of hypertension: Secondary | ICD-10-CM

## 2023-03-05 DIAGNOSIS — R002 Palpitations: Secondary | ICD-10-CM

## 2023-03-05 DIAGNOSIS — R931 Abnormal findings on diagnostic imaging of heart and coronary circulation: Secondary | ICD-10-CM

## 2023-03-05 DIAGNOSIS — E6609 Other obesity due to excess calories: Secondary | ICD-10-CM

## 2023-03-05 DIAGNOSIS — I2510 Atherosclerotic heart disease of native coronary artery without angina pectoris: Secondary | ICD-10-CM

## 2023-03-05 NOTE — Progress Notes (Signed)
ID:  Ruth Roberts, DOB 12-21-1965, MRN [id number]  PCP:  Fanny Bien, MD  Cardiologist:  Rex Kras, DO, [facility]  (established care 08/15/2021)  Date: 03/05/23 Last Office Visit: 09/01/2022  Chief Complaint  Patient presents with   Coronary artery calcification   Follow-up    6 months    HPI  Ruth Roberts is a 57 y.o. female whose past medical history and cardiovascular risk factors include: Mild coronary artery calcification, GERD, anxiety, obesity due to excess calories, hypertension (after COVID infection), former smoker, mild sleep apnea, Hx of COVID 19 infection.   Initially referred to the practice in 2022 for evaluation of palpitations and cardiovascular evaluation/screening.  She has undergone appropriate cardiovascular testing based on her symptoms and workup and now presents today for 59-month follow-up visit.  Since last office visit patient has not had any palpitations, anginal chest pain, or heart failure symptoms.  Her overall functional capacity is relatively stable.  She still tries to get 2 miles of walking regularly.  She does check her blood pressure sporadically based on the blood pressure readings.  His BP are better controlled (between 120-130 mmHg).  She still remains hesitant with regards to pharmacological therapy given her coronary calcium score and her percentile.  She would like to focus on lifestyle changes for now.  ALLERGIES: No Known Allergies  MEDICATION LIST PRIOR TO VISIT: Current Meds  Medication Sig   albuterol (VENTOLIN HFA) 108 (90 Base) MCG/ACT inhaler Inhale 2 puffs into the lungs every 6 (six) hours as needed for wheezing or shortness of breath.   ALPRAZolam (XANAX) 0.25 MG tablet Take 0.25 mg by mouth daily as needed.   azelastine (ASTELIN) 0.1 % nasal spray SMARTSIG:1-2 Spray(s) Both Nares 1-2 Times Daily   Calcium-Phosphorus-Vitamin D (CALCIUM/VITAMIN D3/ADULT GUMMY PO) Take by mouth daily.   celecoxib (CELEBREX) 200 MG  capsule Take 1 capsule every day by oral route as needed for 30 days.   famotidine (PEPCID) 20 MG tablet Take 20 mg by mouth at bedtime.   fexofenadine-pseudoephedrine (ALLEGRA-D) 60-120 MG 12 hr tablet Take 1 tablet by mouth 2 (two) times daily.   Magnesium Glycinate 100 MG CAPS Take by mouth.   montelukast (SINGULAIR) 10 MG tablet Take 1 tablet (10 mg total) by mouth at bedtime.   Multiple Vitamin (MULTIVITAMIN) capsule Take 1 capsule by mouth daily.   NON FORMULARY Citrucell Fiber Supplement   pantoprazole (PROTONIX) 20 MG tablet Take 20 mg by mouth daily.     PAST MEDICAL HISTORY: Past Medical History:  Diagnosis Date   Coronary artery calcification    GERD (gastroesophageal reflux disease)    History of COVID-19    Hyperlipidemia    Sinus trouble    Sleep apnea     PAST SURGICAL HISTORY: Past Surgical History:  Procedure Laterality Date   WISDOM TOOTH EXTRACTION      FAMILY HISTORY: The patient family history includes Breast cancer in an other family member; Celiac disease in her brother; Colon cancer in an other family member; Lung cancer in her father and mother; Rheum arthritis in her mother; Scleroderma in her mother.  SOCIAL HISTORY:  The patient  reports that she quit smoking about 11 years ago. Her smoking use included cigarettes. She has a 6.25 pack-year smoking history. She has never used smokeless tobacco. She reports current alcohol use. She reports that she does not use drugs.  REVIEW OF SYSTEMS: Review of Systems  Cardiovascular:  Negative for chest pain, claudication, dyspnea  on exertion, irregular heartbeat, leg swelling, near-syncope, orthopnea, palpitations, paroxysmal nocturnal dyspnea and syncope.  Respiratory:  Negative for shortness of breath.   Hematologic/Lymphatic: Negative for bleeding problem.  Musculoskeletal:  Negative for muscle cramps and myalgias.  Neurological:  Negative for dizziness and light-headedness.    PHYSICAL EXAM:    03/05/2023     1:33 PM 10/03/2022   11:40 AM 09/01/2022    3:20 PM  Vitals with BMI  Height 5\' 4"  5\' 4"  5\' 4"   Weight 196 lbs 3 oz 198 lbs 3 oz 198 lbs  BMI [id number]  Systolic [id number]  Diastolic 85 76 79  Pulse 67 90 75    Physical Exam  Constitutional: No distress.  Age appropriate, hemodynamically stable.   Neck: No JVD present.  Cardiovascular: Normal rate, regular rhythm, S1 normal, S2 normal, intact distal pulses and normal pulses. Exam reveals no gallop, no S3 and no S4.  No murmur heard. Pulses:      Dorsalis pedis pulses are 2+ on the right side and 2+ on the left side.       Posterior tibial pulses are 2+ on the right side.  Pulmonary/Chest: Effort normal and breath sounds normal. No stridor. She has no wheezes. She has no rales.  Abdominal: Soft. Bowel sounds are normal. She exhibits no distension. There is no abdominal tenderness.  Musculoskeletal:        General: No edema.     Cervical back: Neck supple.  Neurological: She is alert and oriented to person, place, and time. She has intact cranial nerves (2-12).  Skin: Skin is warm and moist.   CARDIAC DATABASE: EKG: 03/05/2023: Sinus rhythm, 61 bpm, normal axis, left atrial enlargement, without underlying ischemia or injury pattern.  Echocardiogram: 08/22/2021: Normal LV systolic function with visual EF 60-65%. Left ventricle cavity is normal in size. Normal left ventricular wall thickness. Normal global wall motion. Normal diastolic filling pattern, normal LAP. No significant valvular heart disease. No prior study for comparison.  Stress Testing: Exercise treadmill stress test 08/22/2021: Functional status: Good Chest pain: Yes. Reason for stopping exercise: Fatigue/weakness Hypertensive response to exercise: No. Exercise time 9 minutes 39 seconds on Bruce protocol, achieved 11.21 METS, 99% APMHR.  Stress ECG positive for ischemia.  Intermediate risk study -given the nonlimiting anginal discomfort with exercise,  stress ECG notes horizontal ST depressions in the inferolateral leads which resolved within 1 minute into recovery, Duke Treadmill score 0.65.  Heart Catheterization: None  Coronary CTA 09/16/2021: 1. Total coronary calcium score of 17.2. This was 90th percentile for age and sex matched control. 2. Normal coronary origin with right dominance. 3. CAD-RADS = 1 Minimal non-obstructive CAD. Proximal LAD minimal stenosis (0-24%) due to calcified plaque, mid to apical LAD is patent.  Otherwise left main, LCx, and RCA are patent. 4. No significant incidental noncardiac findings noted.  14 day extended Holter monitor: Dominant rhythm normal sinus, followed by sinus tachycardia (burden 11%). Heart rate 44-193 bpm. Avg HR 90, bpm. No atrial fibrillation, ventricular tachycardia, high grade AV block, pauses (3 seconds or longer). Rare episodes of paroxysmal supraventricular tachycardia. Total ventricular ectopic burden <1%. Total supraventricular ectopic burden <1%. Patient triggered events: 13 underlying rhythm is normal sinus without dysrhythmia.  LABORATORY DATA: External Labs: Collected: 07/26/2021 BUN 7, creatinine 0.6 Sodium 139, potassium 4.1, chloride 101, bicarb 25. AST 14, ALT 9, alkaline phosphatase 51 Hemoglobin 12.7 g/dL, hematocrit 38.6% TSH 1.22  07/20/2021: TSH 1.73  External Labs: Collected: 08/02/2022 provided by  the patient performed by PCP. Total cholesterol 178, triglycerides 52, LDL 104, HDL 64. TSH 1.89. Sodium 140, potassium 3.9, chloride 104, bicarb 28, BUN 10, creatinine 0.6. AST 24, ALT 26, alkaline phosphatase 63. Hemoglobin 13.6 g/dL, hematocrit 40.6%   IMPRESSION:    ICD-10-CM   1. Coronary artery calcification  I25.10    I25.84     2. Agatston CAC score, <100  R93.1 EKG 12-Lead    3. Blood pressure elevated without history of HTN  R03.0     4. Palpitations  R00.2 EKG 12-Lead    LONG TERM MONITOR (3-14 DAYS)    5. Class 1 obesity due to excess  calories without serious comorbidity with body mass index (BMI) of 33.0 to 33.9 in adult  E66.09    Z68.33        RECOMMENDATIONS: Ruth Roberts is a 57 y.o. female whose past medical history and cardiac risk factors include: Mild coronary artery calcification, GERD, anxiety, obesity due to excess calories, hypertension (after COVID infection), former smoker, mild sleep apnea, Hx of COVID 19 infection.   Coronary artery calcification Agatston CAC score, <100 Total CAC 17.2 AU, 90th percentile With lifestyle changes her LDL levels have improved from 121 mg/dL to 104 mg/dL. Reluctant to be on aspirin and statin therapy. Has undergone ischemic work-up as outlined above. No additional testing warranted at this time.  Blood pressure elevated without history of HTN Blood pressures are consistent with stage I hypertension. She has a notable history of stage I hypertension greater than 6 months and therefore recommend pharmacological therapy.  However, she remains hesitant and will continue monitoring it at home if it continues to uptrend will consider pharmacological therapy.  Palpitations Significantly improved. But still has intermittent episodes of palpitations. Patient would like to have a repeat Zio patch to evaluate for underlying dysrhythmias. Since her symptoms are so infrequent I did inform the patient that the overall diagnostic yield of a Zio patch was low. Therefore would recommend other ways of monitoring such as her current iWatch which has EKG capabilities and or Kardia mobile.  Patient will reach out for a Zio patch if symptoms of palpitation worsen.  Class 1 obesity due to excess calories without serious comorbidity with body mass index (BMI) of 33.0 to 33.9 in adult Body mass index is 33.68 kg/m. I reviewed with her importance of diet, regular physical activity/exercise, weight loss.   Patient is educated on the importance of increasing physical activity gradually as  tolerated with a goal of moderate intensity exercise for 30 minutes a day 5 days a week.  Overall patient is stable from a cardiovascular standpoint and she is more than welcome to see me on an annual basis or as needed basis.  If she wishes to see me on an annual basis was recommended after her yearly well visit such that she has updated labs for review and reference.  Patient agreeable with the plan of care.  FINAL MEDICATION LIST END OF ENCOUNTER: No orders of the defined types were placed in this encounter.    Medications Discontinued During This Encounter  Medication Reason   Azelastine-Fluticasone 137-50 MCG/ACT SUSP       Current Outpatient Medications:    albuterol (VENTOLIN HFA) 108 (90 Base) MCG/ACT inhaler, Inhale 2 puffs into the lungs every 6 (six) hours as needed for wheezing or shortness of breath., Disp: 18 g, Rfl: 3   ALPRAZolam (XANAX) 0.25 MG tablet, Take 0.25 mg by mouth daily as needed., Disp: ,  Rfl:    azelastine (ASTELIN) 0.1 % nasal spray, SMARTSIG:1-2 Spray(s) Both Nares 1-2 Times Daily, Disp: , Rfl:    Calcium-Phosphorus-Vitamin D (CALCIUM/VITAMIN D3/ADULT GUMMY PO), Take by mouth daily., Disp: , Rfl:    celecoxib (CELEBREX) 200 MG capsule, Take 1 capsule every day by oral route as needed for 30 days., Disp: , Rfl:    famotidine (PEPCID) 20 MG tablet, Take 20 mg by mouth at bedtime., Disp: , Rfl:    fexofenadine-pseudoephedrine (ALLEGRA-D) 60-120 MG 12 hr tablet, Take 1 tablet by mouth 2 (two) times daily., Disp: , Rfl:    Magnesium Glycinate 100 MG CAPS, Take by mouth., Disp: , Rfl:    montelukast (SINGULAIR) 10 MG tablet, Take 1 tablet (10 mg total) by mouth at bedtime., Disp: 30 tablet, Rfl: 6   Multiple Vitamin (MULTIVITAMIN) capsule, Take 1 capsule by mouth daily., Disp: , Rfl:    NON FORMULARY, Citrucell Fiber Supplement, Disp: , Rfl:    pantoprazole (PROTONIX) 20 MG tablet, Take 20 mg by mouth daily., Disp: , Rfl:   Orders Placed This Encounter   Procedures   LONG TERM MONITOR (3-14 DAYS)   EKG 12-Lead     There are no Patient Instructions on file for this visit.   --Continue cardiac medications as reconciled in final medication list. --Return in about 16 months (around 07/01/2024) for Annual follow up visit, Coronary artery calcification, Palpitations. Or sooner if needed. --Continue follow-up with your primary care physician regarding the management of your other chronic comorbid conditions.  Patient's questions and concerns were addressed to her satisfaction. She voices understanding of the instructions provided during this encounter.   This note was created using a voice recognition software as a result there may be grammatical errors inadvertently enclosed that do not reflect the nature of this encounter. Every attempt is made to correct such errors.  Rex Kras, [place], [facility]  Pager:  [phone] Office: [phone]

## 2023-07-31 DIAGNOSIS — Z13220 Encounter for screening for lipoid disorders: Secondary | ICD-10-CM | POA: Diagnosis not present

## 2023-07-31 DIAGNOSIS — Z114 Encounter for screening for human immunodeficiency virus [HIV]: Secondary | ICD-10-CM | POA: Diagnosis not present

## 2023-07-31 DIAGNOSIS — Z0000 Encounter for general adult medical examination without abnormal findings: Secondary | ICD-10-CM | POA: Diagnosis not present

## 2023-08-07 DIAGNOSIS — Z0000 Encounter for general adult medical examination without abnormal findings: Secondary | ICD-10-CM | POA: Diagnosis not present

## 2023-08-07 DIAGNOSIS — Z23 Encounter for immunization: Secondary | ICD-10-CM | POA: Diagnosis not present

## 2023-08-11 ENCOUNTER — Other Ambulatory Visit: Payer: Self-pay | Admitting: Family Medicine

## 2023-08-11 DIAGNOSIS — Z1231 Encounter for screening mammogram for malignant neoplasm of breast: Secondary | ICD-10-CM

## 2023-08-21 DIAGNOSIS — Z23 Encounter for immunization: Secondary | ICD-10-CM | POA: Diagnosis not present

## 2023-08-21 DIAGNOSIS — N898 Other specified noninflammatory disorders of vagina: Secondary | ICD-10-CM | POA: Diagnosis not present

## 2023-09-03 ENCOUNTER — Ambulatory Visit
Admission: RE | Admit: 2023-09-03 | Discharge: 2023-09-03 | Disposition: A | Discharge status: Home / Self Care | Payer: BC Managed Care – PPO | Source: Ambulatory Visit | Attending: Family Medicine | Admitting: Family Medicine

## 2023-09-03 DIAGNOSIS — Z1231 Encounter for screening mammogram for malignant neoplasm of breast: Secondary | ICD-10-CM | POA: Diagnosis not present

## 2023-09-03 DIAGNOSIS — M5451 Vertebrogenic low back pain: Secondary | ICD-10-CM | POA: Diagnosis not present

## 2023-09-04 ENCOUNTER — Other Ambulatory Visit: Payer: Self-pay | Admitting: Pulmonary Disease

## 2023-10-22 DIAGNOSIS — M5451 Vertebrogenic low back pain: Secondary | ICD-10-CM | POA: Diagnosis not present

## 2023-10-31 ENCOUNTER — Other Ambulatory Visit: Payer: Self-pay | Admitting: Pulmonary Disease

## 2023-11-06 DIAGNOSIS — J329 Chronic sinusitis, unspecified: Secondary | ICD-10-CM | POA: Diagnosis not present

## 2023-11-06 DIAGNOSIS — J069 Acute upper respiratory infection, unspecified: Secondary | ICD-10-CM | POA: Diagnosis not present

## 2023-11-06 DIAGNOSIS — J309 Allergic rhinitis, unspecified: Secondary | ICD-10-CM | POA: Diagnosis not present

## 2023-11-06 DIAGNOSIS — B9689 Other specified bacterial agents as the cause of diseases classified elsewhere: Secondary | ICD-10-CM | POA: Diagnosis not present

## 2023-11-15 DIAGNOSIS — M5451 Vertebrogenic low back pain: Secondary | ICD-10-CM | POA: Diagnosis not present

## 2023-12-04 DIAGNOSIS — M5459 Other low back pain: Secondary | ICD-10-CM | POA: Diagnosis not present

## 2023-12-04 DIAGNOSIS — N951 Menopausal and female climacteric states: Secondary | ICD-10-CM | POA: Diagnosis not present

## 2023-12-04 DIAGNOSIS — J069 Acute upper respiratory infection, unspecified: Secondary | ICD-10-CM | POA: Diagnosis not present

## 2023-12-04 DIAGNOSIS — M5451 Vertebrogenic low back pain: Secondary | ICD-10-CM | POA: Diagnosis not present

## 2023-12-04 DIAGNOSIS — I10 Essential (primary) hypertension: Secondary | ICD-10-CM | POA: Diagnosis not present

## 2023-12-07 DIAGNOSIS — Z23 Encounter for immunization: Secondary | ICD-10-CM | POA: Diagnosis not present

## 2023-12-25 DIAGNOSIS — J329 Chronic sinusitis, unspecified: Secondary | ICD-10-CM | POA: Diagnosis not present

## 2023-12-25 DIAGNOSIS — J069 Acute upper respiratory infection, unspecified: Secondary | ICD-10-CM | POA: Diagnosis not present

## 2023-12-25 DIAGNOSIS — R030 Elevated blood-pressure reading, without diagnosis of hypertension: Secondary | ICD-10-CM | POA: Diagnosis not present

## 2023-12-25 DIAGNOSIS — B9689 Other specified bacterial agents as the cause of diseases classified elsewhere: Secondary | ICD-10-CM | POA: Diagnosis not present

## 2023-12-25 DIAGNOSIS — Z1159 Encounter for screening for other viral diseases: Secondary | ICD-10-CM | POA: Diagnosis not present

## 2024-01-01 ENCOUNTER — Encounter: Payer: Self-pay | Admitting: Pulmonary Disease

## 2024-01-07 DIAGNOSIS — Z23 Encounter for immunization: Secondary | ICD-10-CM | POA: Diagnosis not present

## 2024-01-11 ENCOUNTER — Telehealth (INDEPENDENT_AMBULATORY_CARE_PROVIDER_SITE_OTHER): Payer: Self-pay | Admitting: Otolaryngology

## 2024-01-11 NOTE — Telephone Encounter (Signed)
LVM to confirm appt & location [id number] afm

## 2024-01-12 ENCOUNTER — Ambulatory Visit (INDEPENDENT_AMBULATORY_CARE_PROVIDER_SITE_OTHER): Payer: BC Managed Care – PPO | Admitting: Otolaryngology

## 2024-01-12 ENCOUNTER — Encounter (INDEPENDENT_AMBULATORY_CARE_PROVIDER_SITE_OTHER): Payer: Self-pay

## 2024-01-12 VITALS — BP 113/75 | HR 73 | Ht 64.0 in | Wt 195.0 lb

## 2024-01-12 DIAGNOSIS — J310 Chronic rhinitis: Secondary | ICD-10-CM

## 2024-01-12 DIAGNOSIS — J342 Deviated nasal septum: Secondary | ICD-10-CM | POA: Diagnosis not present

## 2024-01-12 DIAGNOSIS — R0981 Nasal congestion: Secondary | ICD-10-CM

## 2024-01-12 DIAGNOSIS — J343 Hypertrophy of nasal turbinates: Secondary | ICD-10-CM

## 2024-01-14 DIAGNOSIS — J310 Chronic rhinitis: Secondary | ICD-10-CM | POA: Insufficient documentation

## 2024-01-14 DIAGNOSIS — J342 Deviated nasal septum: Secondary | ICD-10-CM | POA: Insufficient documentation

## 2024-01-14 DIAGNOSIS — J343 Hypertrophy of nasal turbinates: Secondary | ICD-10-CM | POA: Insufficient documentation

## 2024-01-14 NOTE — Progress Notes (Signed)
Patient ID: [name], female   DOB: 12-14-1966, 58 y.o.   MRN: [id number]  Follow-up: Chronic nasal obstruction  HPI: The patient is a 58 year old female who presents today complaining of persistent nasal obstruction. She was last seen in 2022.  At that time, she was complaining of chronic nasal obstruction.  She was noted to have's bilateral severe inferior turbinate hypertrophy, nasal septal deviation, and septal spur.  She was treated with multiple allergy medications and steroid nasal sprays.  The patient returns today complaining of persistent nasal obstruction.  She is currently on azelastine and Nasonex nasal sprays.  She was also diagnosed with an acute sinusitis last year.  She currently uses a Breathe Right strip at night.  Exam: General: Communicates without difficulty, well nourished, no acute distress. Head: Normocephalic, no evidence injury, no tenderness, facial buttresses intact without stepoff. Face/sinus: No tenderness to palpation and percussion. Facial movement is normal and symmetric. Eyes: PERRL, EOMI. No scleral icterus, conjunctivae clear. Neuro: CN II exam reveals vision grossly intact.  No nystagmus at any point of gaze. Ears: Auricles well formed without lesions.  Ear canals are intact without mass or lesion.  No erythema or edema is appreciated.  The TMs are intact without fluid. Nose: External evaluation reveals normal support and skin without lesions.  Dorsum is intact.  Anterior rhinoscopy reveals congested mucosa over anterior aspect of inferior turbinates and deviated septum.  Both inferior turbinates are severely hypertrophied.  No purulence noted. Oral:  Oral cavity and oropharynx are intact, symmetric, without erythema or edema.  Mucosa is moist without lesions. Neck: Full range of motion without pain.  There is no significant lymphadenopathy.  No masses palpable.  Thyroid bed within normal limits to palpation.  Parotid glands and submandibular glands equal  bilaterally without mass.  Trachea is midline. Neuro:  CN 2-12 grossly intact.   Assessment: 1.  Chronic rhinitis with nasal mucosal congestion, nasal septal deviation, and bilateral inferior turbinate hypertrophy.  More than 95% of her nasal passageways are obstructed bilaterally. 2.  The patient has not responded to medical treatment, including use of allergy medications and steroid nasal sprays.  Plan: 1.  The physical exam findings are reviewed with the patient. 2.  Continue the use of Nasonex nasal spray 2 sprays each nostril daily.  She may also use azelastine nasal spray as needed. 3.  Humidifier and nasal ointment as needed. 4.  Based on the above findings, the patient may benefit from surgical intervention with septoplasty and bilateral turbinate reduction.  The risks, benefits, alternatives, and details of the procedures are extensively discussed.  Questions are invited and answered. 5.  The patient would like to proceed with the septoplasty and turbinate reduction surgery.

## 2024-01-19 ENCOUNTER — Telehealth (INDEPENDENT_AMBULATORY_CARE_PROVIDER_SITE_OTHER): Payer: Self-pay | Admitting: Otolaryngology

## 2024-01-19 ENCOUNTER — Other Ambulatory Visit: Payer: Self-pay | Admitting: Medical Genetics

## 2024-01-19 NOTE — Telephone Encounter (Signed)
Patient called to reschedule surgery.  Please call.  [phone]

## 2024-01-21 ENCOUNTER — Ambulatory Visit: Payer: 59 | Admitting: Pulmonary Disease

## 2024-02-05 ENCOUNTER — Encounter (INDEPENDENT_AMBULATORY_CARE_PROVIDER_SITE_OTHER): Payer: BC Managed Care – PPO

## 2024-02-12 DIAGNOSIS — Z23 Encounter for immunization: Secondary | ICD-10-CM | POA: Diagnosis not present

## 2024-02-19 ENCOUNTER — Ambulatory Visit (HOSPITAL_BASED_OUTPATIENT_CLINIC_OR_DEPARTMENT_OTHER): Payer: 59 | Admitting: Cardiovascular Disease

## 2024-02-19 ENCOUNTER — Encounter (HOSPITAL_BASED_OUTPATIENT_CLINIC_OR_DEPARTMENT_OTHER): Payer: Self-pay | Admitting: Cardiovascular Disease

## 2024-02-19 ENCOUNTER — Other Ambulatory Visit (HOSPITAL_BASED_OUTPATIENT_CLINIC_OR_DEPARTMENT_OTHER): Payer: Self-pay

## 2024-02-19 VITALS — BP 127/82 | HR 65 | Ht 64.0 in | Wt 201.2 lb

## 2024-02-19 DIAGNOSIS — G4733 Obstructive sleep apnea (adult) (pediatric): Secondary | ICD-10-CM | POA: Diagnosis not present

## 2024-02-19 DIAGNOSIS — I2510 Atherosclerotic heart disease of native coronary artery without angina pectoris: Secondary | ICD-10-CM

## 2024-02-19 DIAGNOSIS — R030 Elevated blood-pressure reading, without diagnosis of hypertension: Secondary | ICD-10-CM | POA: Diagnosis not present

## 2024-02-19 DIAGNOSIS — E785 Hyperlipidemia, unspecified: Secondary | ICD-10-CM

## 2024-02-19 DIAGNOSIS — Z5181 Encounter for therapeutic drug level monitoring: Secondary | ICD-10-CM

## 2024-02-19 MED ORDER — ROSUVASTATIN CALCIUM 10 MG PO TABS: 10.0000 mg | ORAL_TABLET | Freq: Every day | ORAL | 3 refills | Status: DC

## 2024-02-19 MED ORDER — ROSUVASTATIN CALCIUM 10 MG PO TABS
10.0000 mg | ORAL_TABLET | Freq: Every day | ORAL | 3 refills | Status: AC
  Filled 2024-02-19: qty 90, 90d supply, fill #0
  Filled 2024-12-05: qty 90, 90d supply, fill #1
  Filled 2025-01-02: qty 90, 90d supply, fill #2

## 2024-02-19 NOTE — Patient Instructions (Signed)
 Medication Instructions:  START ROSUVASTATIN 10 MG DAILY   Labwork: FASTING LP/CMET IN 2 TO 3 MONTHS   Testing/Procedures: NONE  Follow-Up: 6 MONTHS WITH MICHELLE S NP [facility]   Any Other Special Instructions Will Be Listed Below (If Applicable).     If you need a refill on your cardiac medications before your next appointment, please call your pharmacy.

## 2024-02-19 NOTE — Progress Notes (Signed)
 [facility] Initial Assessment:    Date:  02/21/2024   ID:  Ruth Roberts, DOB 1966-09-06, MRN [id number]  PCP:  Ruth Moccasin, MD  Cardiologist:  Ruth Si, MD   Referring MD: Ruth Moccasin, MD   CC: Hypertension  History of Present Illness:    Ruth Roberts is a 58 y.o. female with a hx of nonobstructive CAD, mild OSA, and prior tobacco abuse here to establish care in the [facility].  She saw Dr. Odis Roberts in 2022 for palpitations.  At her last visit 02/2023 she was exercising regularly and blood pressures were in the 120s to 130s.  She also previously reported palpitations at which time she wore a 14-day monitor that showed sinus rhythm and sinus tachycardia.  There were no arrhythmias.  There were several patient triggered events at which time sinus rhythm was noted.  Ruth Roberts has a history of coronary artery disease, with a CT scan revealing an area of blockage and a calcium score of 17, placing her in the 90th percentile for her age and gender. Her LDL cholesterol was 121 mg/dL in 5784 and has decreased to 83 mg/dL through diet and exercise. She is concerned about her cholesterol levels and the potential need for statin therapy.  She experiences mildly elevated blood pressure, which she attributes to post-COVID symptoms. Her blood pressure readings have varied, with recent averages around 120s/80s, but she struggles to maintain readings consistently below 120/80. She reports that her blood pressure was normal before contracting COVID three times between 2020 and 2022.  She reports shortness of breath and feels outpaced by peers during physical activities such as walking or hiking. She walks about two miles in 40 minutes, five days a week, and owns an exercise bike and stair stepper. No chest pain or leg swelling during these activities.  Her diet has improved, focusing on reducing fast food and red meat consumption, although she  occasionally indulges in cookies. She has been smoke-free for over ten years after smoking for more than 20 years, starting in high school and continuing through young adulthood.     Previous antihypertensives:    Past Medical History:  Diagnosis Date   Coronary artery calcification    GERD (gastroesophageal reflux disease)    History of COVID-19    Hyperlipidemia    Sinus trouble    Sleep apnea     Past Surgical History:  Procedure Laterality Date   WISDOM TOOTH EXTRACTION      Current Medications: Current Meds  Medication Sig   albuterol (VENTOLIN HFA) 108 (90 Base) MCG/ACT inhaler INHALE 2 PUFFS INTO THE LUNGS EVERY 6 HOURS AS NEEDED FOR WHEEZING OR SHORTNESS OF BREATH   ALPRAZolam (XANAX) 0.25 MG tablet Take 0.25 mg by mouth daily as needed.   azelastine (ASTELIN) 0.1 % nasal spray SMARTSIG:1-2 Spray(s) Both Nares 1-2 Times Daily   Calcium-Phosphorus-Vitamin D (CALCIUM/VITAMIN D3/ADULT GUMMY PO) Take by mouth daily.   celecoxib (CELEBREX) 200 MG capsule Take 1 capsule every day by oral route as needed for 30 days.   famotidine (PEPCID) 20 MG tablet Take 20 mg by mouth at bedtime.   levocetirizine (XYZAL) 5 MG tablet Take 5 mg by mouth every evening.   Magnesium Glycinate 100 MG CAPS Take by mouth.   montelukast (SINGULAIR) 10 MG tablet TAKE 1 TABLET(10 MG) BY MOUTH AT BEDTIME   Multiple Vitamin (MULTIVITAMIN) capsule Take 1 capsule by mouth daily.   NON FORMULARY Citrucell Fiber Supplement  pantoprazole (PROTONIX) 20 MG tablet Take 20 mg by mouth daily.   [DISCONTINUED] rosuvastatin (CRESTOR) 10 MG tablet Take 1 tablet (10 mg total) by mouth daily.     Allergies:   Patient has no known allergies.   Social History   Socioeconomic History   Marital status: Single    Spouse name: Not on file   Number of children: Not on file   Years of education: Not on file   Highest education level: Not on file  Occupational History   Not on file  Tobacco Use   Smoking status:  Former    Current packs/day: 0.00    Average packs/day: 0.3 packs/day for 25.0 years (6.3 ttl pk-yrs)    Types: Cigarettes    Start date: 73    Quit date: 2013    Years since quitting: 12.1   Smokeless tobacco: Never   Tobacco comments:    social   Vaping Use   Vaping status: Never Used  Substance and Sexual Activity   Alcohol use: Yes    Comment: rare   Drug use: No   Sexual activity: Not on file  Other Topics Concern   Not on file  Social History Narrative   Patient is single and lives at home alone. Patient works at a [employer]. Patient has a high school education.   Social Drivers of [employer] Strain: Not on file  Food Insecurity: No Food Insecurity (02/19/2024)   Hunger Vital Sign    Worried About Running Out of Food in the Last Year: Never true    Ran Out of Food in the Last Year: Never true  Transportation Needs: No Transportation Needs (02/19/2024)   PRAPARE - Administrator, Civil Service (Medical): No    Lack of Transportation (Non-Medical): No  Physical Activity: Sufficiently Active (02/19/2024)   Exercise Vital Sign    Days of Exercise per Week: 5 days    Minutes of Exercise per Session: 50 min  Stress: Not on file  Social Connections: Not on file    Family History: The patient's family history includes Breast cancer in an other family member; Celiac disease in her brother; Colon cancer in an other family member; Hypertension in her maternal aunt and maternal grandmother; Lung cancer in her father and mother; Rheum arthritis in her mother; Scleroderma in her mother; Stroke in her maternal grandfather.  ROS:   Please see the history of present illness.     All other systems reviewed and are negative.  EKGs/Labs/Other Studies Reviewed:    EKG:  EKG is ordered today.     EKG Interpretation Date/Time:  Friday February 19 2024 08:43:27 EST Ventricular Rate:  67 PR Interval:  174 QRS Duration:  88 QT Interval:  398 QTC  Calculation: 420 R Axis:   66  Text Interpretation: Normal sinus rhythm When compared with ECG of 29-Jul-2021 18:40, No significant change was found Confirmed by Ruth Roberts ([id number]) on 02/19/2024 9:08:58 AM       Coronary CTA 09/16/2021: 1. Total coronary calcium score of 17.2. This was 90th percentile for age and sex matched control. 2. Normal coronary origin with right dominance. 3. CAD-RADS = 1 Minimal non-obstructive CAD. Proximal LAD minimal stenosis (0-24%) due to calcified plaque, mid to apical LAD is patent.  Otherwise left main, LCx, and RCA are patent. 4. No significant incidental noncardiac findings noted.   14 day extended Holter monitor 09/2021: Dominant rhythm normal sinus, followed by sinus tachycardia (  burden 11%). Heart rate 44-193 bpm. Avg HR 90, bpm. No atrial fibrillation, ventricular tachycardia, high grade AV block, pauses (3 seconds or longer). Rare episodes of paroxysmal supraventricular tachycardia. Total ventricular ectopic burden <1%. Total supraventricular ectopic burden <1%. Patient triggered events: 13 underlying rhythm is normal sinus without dysrhythmia.  Recent Labs: No results found for requested labs within last 365 days.   Recent Lipid Panel    Component Value Date/Time   CHOL 193 12/10/2021 0908   TRIG 47 12/10/2021 0908   HDL 63 12/10/2021 0908   LDLCALC 121 (H) 12/10/2021 0908   LDLDIRECT 121 (H) 12/10/2021 0908    Physical Exam:   VS:  BP 127/82   Pulse 65   Ht 5\' 4"  (1.626 m)   Wt 201 lb 3.2 oz (91.3 kg)   SpO2 99%   BMI 34.54 kg/m  , BMI Body mass index is 34.54 kg/m. GENERAL:  Well appearing HEENT: Pupils equal round and reactive, fundi not visualized, oral mucosa unremarkable NECK:  No jugular venous distention, waveform within normal limits, carotid upstroke brisk and symmetric, no bruits, no thyromegaly LUNGS:  Clear to auscultation bilaterally HEART:  RRR.  PMI not displaced or sustained,S1 and S2 within normal  limits, no S3, no S4, no clicks, no rubs, no murmurs ABD:  Flat, positive bowel sounds normal in frequency in pitch, no bruits, no rebound, no guarding, no midline pulsatile mass, no hepatomegaly, no splenomegaly EXT:  2 plus pulses throughout, no edema, no cyanosis no clubbing SKIN:  No rashes no nodules NEURO:  Cranial nerves II through XII grossly intact, motor grossly intact throughout PSYCH:  Cognitively intact, oriented to person place and time  ASSESSMENT/PLAN:    # Coronary Artery Disease Non-obstructive plaque noted on CT scan with a calcium score of 17. LDL cholesterol improved to 83 with lifestyle modifications but still above goal of <70. No symptoms of angina. -Start Rosuvastatin 10mg  daily. -Check lipid panel and comprehensive metabolic panel in 2-3 months.  # Hypertension Blood pressure readings ranging from 110s-140s systolic and 70s-90s diastolic. No symptoms of end-organ damage. Discussed the importance of salt restriction and exercise. -Continue lifestyle modifications including salt restriction and regular exercise. -Consider medication if blood pressure remains consistently elevated.  # General Health Maintenance History of smoking and overweight. Improved lifestyle choices including diet and exercise. -Encourage continuation of healthy lifestyle choices including regular exercise and a balanced diet. -Follow up in 6 months with nurse practitioner and in 1 year with the physician.      Screening for Secondary Hypertension:     Relevant Labs/Studies:    Latest Ref Rng & Units 12/10/2021    9:08 AM 09/10/2021    2:30 PM 07/30/2021   12:58 AM  Basic Labs  Sodium 134 - 144 mmol/L 139  142  139   Potassium 3.5 - 5.2 mmol/L 4.1  4.4  3.6   Creatinine 0.57 - 1.00 mg/dL 9.14  7.82  9.56      Disposition:    FU with MD/PharmD in 6 months    Medication Adjustments/Labs and Tests Ordered: Current medicines are reviewed at length with the patient today.  Concerns  regarding medicines are outlined above.  Orders Placed This Encounter  Procedures   Lipid panel   Comprehensive metabolic panel   EKG 12-Lead   Meds ordered this encounter  Medications   DISCONTD: rosuvastatin (CRESTOR) 10 MG tablet    Sig: Take 1 tablet (10 mg total) by mouth daily.  Dispense:  90 tablet    Refill:  3   rosuvastatin (CRESTOR) 10 MG tablet    Sig: Take 1 tablet (10 mg total) by mouth daily.    Dispense:  90 tablet    Refill:  3    Signed, Ruth Si, MD  02/21/2024 7:54 AM    [facility]

## 2024-02-21 ENCOUNTER — Encounter (HOSPITAL_BASED_OUTPATIENT_CLINIC_OR_DEPARTMENT_OTHER): Payer: Self-pay | Admitting: Cardiovascular Disease

## 2024-02-23 ENCOUNTER — Other Ambulatory Visit (HOSPITAL_BASED_OUTPATIENT_CLINIC_OR_DEPARTMENT_OTHER): Payer: Self-pay

## 2024-02-23 ENCOUNTER — Other Ambulatory Visit (HOSPITAL_COMMUNITY): Payer: Self-pay

## 2024-02-24 ENCOUNTER — Other Ambulatory Visit (HOSPITAL_BASED_OUTPATIENT_CLINIC_OR_DEPARTMENT_OTHER): Payer: Self-pay

## 2024-02-24 MED ORDER — MONTELUKAST SODIUM 10 MG PO TABS
10.0000 mg | ORAL_TABLET | Freq: Every evening | ORAL | 3 refills | Status: DC
  Filled 2024-02-24: qty 90, 90d supply, fill #0

## 2024-02-24 MED ORDER — PREDNISONE 5 MG (21) PO TBPK
ORAL_TABLET | ORAL | 1 refills | Status: DC
  Filled 2024-02-24: qty 21, 6d supply, fill #0

## 2024-02-24 MED ORDER — AZELASTINE HCL 137 MCG/SPRAY NA SOLN
1.0000 | Freq: Every day | NASAL | 11 refills | Status: AC
  Filled 2024-02-24: qty 30, 30d supply, fill #0
  Filled 2024-08-25: qty 30, 30d supply, fill #1

## 2024-02-25 ENCOUNTER — Telehealth: Payer: 59 | Admitting: Adult Health

## 2024-03-03 DIAGNOSIS — H40053 Ocular hypertension, bilateral: Secondary | ICD-10-CM | POA: Diagnosis not present

## 2024-03-03 DIAGNOSIS — H2513 Age-related nuclear cataract, bilateral: Secondary | ICD-10-CM | POA: Diagnosis not present

## 2024-03-03 DIAGNOSIS — H524 Presbyopia: Secondary | ICD-10-CM | POA: Diagnosis not present

## 2024-03-18 ENCOUNTER — Encounter: Payer: Self-pay | Admitting: Adult Health

## 2024-03-18 ENCOUNTER — Telehealth: Payer: Self-pay | Admitting: *Deleted

## 2024-03-18 ENCOUNTER — Other Ambulatory Visit (HOSPITAL_BASED_OUTPATIENT_CLINIC_OR_DEPARTMENT_OTHER): Payer: Self-pay

## 2024-03-18 ENCOUNTER — Other Ambulatory Visit: Payer: Self-pay

## 2024-03-18 ENCOUNTER — Telehealth (INDEPENDENT_AMBULATORY_CARE_PROVIDER_SITE_OTHER): Admitting: Adult Health

## 2024-03-18 DIAGNOSIS — G4733 Obstructive sleep apnea (adult) (pediatric): Secondary | ICD-10-CM

## 2024-03-18 DIAGNOSIS — J342 Deviated nasal septum: Secondary | ICD-10-CM

## 2024-03-18 DIAGNOSIS — J4520 Mild intermittent asthma, uncomplicated: Secondary | ICD-10-CM

## 2024-03-18 DIAGNOSIS — J3089 Other allergic rhinitis: Secondary | ICD-10-CM

## 2024-03-18 MED ORDER — MONTELUKAST SODIUM 10 MG PO TABS
10.0000 mg | ORAL_TABLET | Freq: Every day | ORAL | 3 refills | Status: AC
  Filled 2024-03-18: qty 90, 90d supply, fill #0
  Filled 2024-07-16: qty 90, 90d supply, fill #1
  Filled 2024-12-05: qty 90, 90d supply, fill #2

## 2024-03-18 MED ORDER — ALBUTEROL SULFATE HFA 108 (90 BASE) MCG/ACT IN AERS
1.0000 | INHALATION_SPRAY | Freq: Four times a day (QID) | RESPIRATORY_TRACT | 2 refills | Status: AC | PRN
  Filled 2024-03-18: qty 6.7, 25d supply, fill #0
  Filled 2024-08-25: qty 6.7, 25d supply, fill #1

## 2024-03-18 NOTE — Patient Instructions (Signed)
 Continue on Xyzal daily Continue on Astelin nasal spray daily Continue on Singulair daily Saline nasal rinses Twice daily  .  Saline nasal gel At bedtime   Oral appliance At bedtime   Follow-up with ENT as planned Follow up with Dr. Wynona Neat or Lysa Livengood NP in 1 year and As needed   Please contact office for sooner follow up if symptoms do not improve or worsen or seek emergency care

## 2024-03-18 NOTE — Progress Notes (Signed)
 Virtual Visit via Video Note  I connected with Ruth Roberts on 03/18/24 at  2:00 PM EDT by a video enabled telemedicine application and verified that I am speaking with the correct person using two identifiers.  Location: Patient: Home  Provider: Office    I discussed the limitations of evaluation and management by telemedicine and the availability of in person appointments. The patient expressed understanding and agreed to proceed.  History of Present Illness: 58 yo female followed for OSA and Mild intermittent asthma and allergic rhinitis   Today's video visit is a 1 year follow-up.  Patient says overall her asthma has been doing well.  She has occasionally used her albuterol.  She denies any flare of cough or wheezing. She has been following with ENT as she has had some chronic sinus issues with nasal congestion drainage and stuffiness over the last several months.  She has been told that she has a deviated septum and been recommended for surgical repair.  She currently is on Astelin, Singulair, and Xyzal daily.  Uses Nasonex on occasion.  She denies any fever or discolored mucus. She has mild obstructive sleep apnea uses oral appliance most every night.  Does feel that she benefits with decreased snoring.  Past Medical History:  Diagnosis Date   Coronary artery calcification    GERD (gastroesophageal reflux disease)    History of COVID-19    Hyperlipidemia    Sinus trouble    Sleep apnea    Current Outpatient Medications on File Prior to Visit  Medication Sig Dispense Refill   ALPRAZolam (XANAX) 0.25 MG tablet Take 0.25 mg by mouth daily as needed.     azelastine (ASTELIN) 0.1 % nasal spray SMARTSIG:1-2 Spray(s) Both Nares 1-2 Times Daily     Azelastine HCl 137 MCG/SPRAY SOLN Place 1-2 sprays into both nostrils once to twice daily for nasal congestion 30 mL 11   Calcium-Phosphorus-Vitamin D (CALCIUM/VITAMIN D3/ADULT GUMMY PO) Take by mouth daily.     celecoxib (CELEBREX) 200  MG capsule Take 1 capsule every day by oral route as needed for 30 days.     famotidine (PEPCID) 20 MG tablet Take 20 mg by mouth at bedtime.     levocetirizine (XYZAL) 5 MG tablet Take 5 mg by mouth every evening.     Magnesium Glycinate 100 MG CAPS Take by mouth.     Multiple Vitamin (MULTIVITAMIN) capsule Take 1 capsule by mouth daily.     NON FORMULARY Citrucell Fiber Supplement     pantoprazole (PROTONIX) 20 MG tablet Take 20 mg by mouth daily.     predniSONE (STERAPRED UNI-PAK 21 TAB) 5 MG (21) TBPK tablet Follow package directions 21 tablet 1   rosuvastatin (CRESTOR) 10 MG tablet Take 1 tablet (10 mg total) by mouth daily. 90 tablet 3   No current facility-administered medications on file prior to visit.      Observations/Objective: 03/18/2024 appears well no acute distress.  Assessment and Plan: Mild obstructive sleep apnea.  Patient endorses compliance with oral appliance each night.  Has perceived benefit.  Continue with nightly usage of mandibular advancement device. Continue to work on healthy weight loss.  Mild intermittent asthma.  Appears to be well-controlled.  Albuterol as needed.  Asthma action plan discussed.  Chronic allergic rhinitis.  Continue on current regimen.  Patient with increased symptom burden over the last several months.  Continue on aggressive maintenance regimen with Xyzal, Astelin, Singulair.  Refills were sent.  Deviated septum.  Continue follow-up with  ENT.  Contemplating surgical repair.  Follow Up Instructions: Follow-up in 1 year and as needed   I discussed the assessment and treatment plan with the patient. The patient was provided an opportunity to ask questions and all were answered. The patient agreed with the plan and demonstrated an understanding of the instructions.   The patient was advised to call back or seek an in-person evaluation if the symptoms worsen or if the condition fails to improve as anticipated.  I provided 21 minutes of  non-face-to-face time during this encounter.   Rubye Oaks, NP

## 2024-03-18 NOTE — Telephone Encounter (Signed)
 Airview showing CPAP data >56 year old.  ATC patient x1.  LVM to see if she has an SD card in her CPAP machine.  Sent a [employer] as well.

## 2024-04-21 ENCOUNTER — Other Ambulatory Visit (HOSPITAL_BASED_OUTPATIENT_CLINIC_OR_DEPARTMENT_OTHER): Payer: Self-pay

## 2024-04-21 DIAGNOSIS — J309 Allergic rhinitis, unspecified: Secondary | ICD-10-CM | POA: Diagnosis not present

## 2024-04-21 DIAGNOSIS — J329 Chronic sinusitis, unspecified: Secondary | ICD-10-CM | POA: Diagnosis not present

## 2024-04-21 DIAGNOSIS — N8110 Cystocele, unspecified: Secondary | ICD-10-CM | POA: Diagnosis not present

## 2024-04-21 DIAGNOSIS — Z7185 Encounter for immunization safety counseling: Secondary | ICD-10-CM | POA: Diagnosis not present

## 2024-04-21 MED ORDER — ESTRADIOL 0.1 MG/GM VA CREA
1.0000 g | TOPICAL_CREAM | Freq: Every day | VAGINAL | 11 refills | Status: AC
  Filled 2024-04-21: qty 42.5, 30d supply, fill #0
  Filled 2024-07-16: qty 42.5, 30d supply, fill #1
  Filled 2024-08-25: qty 42.5, 30d supply, fill #2

## 2024-04-28 ENCOUNTER — Encounter (INDEPENDENT_AMBULATORY_CARE_PROVIDER_SITE_OTHER): Payer: BC Managed Care – PPO | Admitting: Otolaryngology

## 2024-05-05 DIAGNOSIS — Z23 Encounter for immunization: Secondary | ICD-10-CM | POA: Diagnosis not present

## 2024-05-27 ENCOUNTER — Other Ambulatory Visit (HOSPITAL_BASED_OUTPATIENT_CLINIC_OR_DEPARTMENT_OTHER): Payer: Self-pay

## 2024-05-27 MED ORDER — PANTOPRAZOLE SODIUM 40 MG PO TBEC
40.0000 mg | DELAYED_RELEASE_TABLET | Freq: Every day | ORAL | 0 refills | Status: DC
  Filled 2024-05-27: qty 30, 30d supply, fill #0

## 2024-06-01 ENCOUNTER — Other Ambulatory Visit (HOSPITAL_BASED_OUTPATIENT_CLINIC_OR_DEPARTMENT_OTHER): Payer: Self-pay

## 2024-06-01 DIAGNOSIS — K219 Gastro-esophageal reflux disease without esophagitis: Secondary | ICD-10-CM | POA: Diagnosis not present

## 2024-06-01 MED ORDER — PANTOPRAZOLE SODIUM 40 MG PO TBEC
40.0000 mg | DELAYED_RELEASE_TABLET | Freq: Every day | ORAL | 3 refills | Status: AC
  Filled 2024-06-01: qty 90, 90d supply, fill #0

## 2024-06-01 MED ORDER — PANTOPRAZOLE SODIUM 20 MG PO TBEC
20.0000 mg | DELAYED_RELEASE_TABLET | Freq: Every day | ORAL | 3 refills | Status: AC
  Filled 2024-06-01 – 2024-07-16 (×2): qty 90, 90d supply, fill #0
  Filled 2024-12-05: qty 90, 90d supply, fill #1

## 2024-06-03 DIAGNOSIS — Z23 Encounter for immunization: Secondary | ICD-10-CM | POA: Diagnosis not present

## 2024-06-06 ENCOUNTER — Ambulatory Visit (HOSPITAL_BASED_OUTPATIENT_CLINIC_OR_DEPARTMENT_OTHER): Admit: 2024-06-06 | Payer: BC Managed Care – PPO | Admitting: Otolaryngology

## 2024-06-06 ENCOUNTER — Encounter (HOSPITAL_BASED_OUTPATIENT_CLINIC_OR_DEPARTMENT_OTHER): Payer: Self-pay

## 2024-06-06 SURGERY — SEPTOPLASTY, NOSE, WITH NASAL TURBINATE REDUCTION
Anesthesia: Choice | Laterality: Bilateral

## 2024-06-09 ENCOUNTER — Encounter (INDEPENDENT_AMBULATORY_CARE_PROVIDER_SITE_OTHER): Admitting: Otolaryngology

## 2024-07-02 ENCOUNTER — Other Ambulatory Visit (HOSPITAL_BASED_OUTPATIENT_CLINIC_OR_DEPARTMENT_OTHER): Payer: Self-pay

## 2024-07-02 DIAGNOSIS — U071 COVID-19: Secondary | ICD-10-CM | POA: Diagnosis not present

## 2024-07-02 MED ORDER — PAXLOVID (300/100) 20 X 150 MG & 10 X 100MG PO TBPK
ORAL_TABLET | ORAL | 0 refills | Status: DC
  Filled 2024-07-02: qty 30, 5d supply, fill #0

## 2024-07-07 ENCOUNTER — Ambulatory Visit: Payer: Self-pay | Admitting: Cardiology

## 2024-07-16 ENCOUNTER — Other Ambulatory Visit (HOSPITAL_BASED_OUTPATIENT_CLINIC_OR_DEPARTMENT_OTHER): Payer: Self-pay

## 2024-07-18 ENCOUNTER — Other Ambulatory Visit (HOSPITAL_BASED_OUTPATIENT_CLINIC_OR_DEPARTMENT_OTHER): Payer: Self-pay

## 2024-07-21 DIAGNOSIS — R635 Abnormal weight gain: Secondary | ICD-10-CM | POA: Diagnosis not present

## 2024-07-21 DIAGNOSIS — U071 COVID-19: Secondary | ICD-10-CM | POA: Diagnosis not present

## 2024-07-21 DIAGNOSIS — G500 Trigeminal neuralgia: Secondary | ICD-10-CM | POA: Diagnosis not present

## 2024-07-22 DIAGNOSIS — R5383 Other fatigue: Secondary | ICD-10-CM | POA: Diagnosis not present

## 2024-07-22 DIAGNOSIS — I509 Heart failure, unspecified: Secondary | ICD-10-CM | POA: Diagnosis not present

## 2024-07-22 DIAGNOSIS — E559 Vitamin D deficiency, unspecified: Secondary | ICD-10-CM | POA: Diagnosis not present

## 2024-07-27 DIAGNOSIS — Z23 Encounter for immunization: Secondary | ICD-10-CM | POA: Diagnosis not present

## 2024-07-27 DIAGNOSIS — Z8639 Personal history of other endocrine, nutritional and metabolic disease: Secondary | ICD-10-CM | POA: Diagnosis not present

## 2024-07-27 DIAGNOSIS — G4733 Obstructive sleep apnea (adult) (pediatric): Secondary | ICD-10-CM | POA: Diagnosis not present

## 2024-07-27 DIAGNOSIS — E559 Vitamin D deficiency, unspecified: Secondary | ICD-10-CM | POA: Diagnosis not present

## 2024-07-27 DIAGNOSIS — U099 Post covid-19 condition, unspecified: Secondary | ICD-10-CM | POA: Diagnosis not present

## 2024-08-10 DIAGNOSIS — Z0000 Encounter for general adult medical examination without abnormal findings: Secondary | ICD-10-CM | POA: Diagnosis not present

## 2024-08-10 DIAGNOSIS — Z13220 Encounter for screening for lipoid disorders: Secondary | ICD-10-CM | POA: Diagnosis not present

## 2024-08-12 DIAGNOSIS — Z0000 Encounter for general adult medical examination without abnormal findings: Secondary | ICD-10-CM | POA: Diagnosis not present

## 2024-08-18 ENCOUNTER — Other Ambulatory Visit: Payer: Self-pay | Admitting: Family Medicine

## 2024-08-18 DIAGNOSIS — Z1231 Encounter for screening mammogram for malignant neoplasm of breast: Secondary | ICD-10-CM

## 2024-08-25 DIAGNOSIS — Z7185 Encounter for immunization safety counseling: Secondary | ICD-10-CM | POA: Diagnosis not present

## 2024-08-25 DIAGNOSIS — Z23 Encounter for immunization: Secondary | ICD-10-CM | POA: Diagnosis not present

## 2024-08-26 ENCOUNTER — Other Ambulatory Visit (HOSPITAL_BASED_OUTPATIENT_CLINIC_OR_DEPARTMENT_OTHER): Payer: Self-pay

## 2024-09-08 ENCOUNTER — Ambulatory Visit
Admission: RE | Admit: 2024-09-08 | Discharge: 2024-09-08 | Disposition: A | Discharge status: Home / Self Care | Source: Ambulatory Visit | Attending: Family Medicine | Admitting: Family Medicine

## 2024-09-08 DIAGNOSIS — Z1231 Encounter for screening mammogram for malignant neoplasm of breast: Secondary | ICD-10-CM

## 2024-09-26 ENCOUNTER — Ambulatory Visit: Admitting: Obstetrics

## 2024-10-03 ENCOUNTER — Ambulatory Visit: Admitting: Obstetrics

## 2024-10-14 ENCOUNTER — Other Ambulatory Visit: Payer: Self-pay | Admitting: Medical Genetics

## 2024-10-14 DIAGNOSIS — Z006 Encounter for examination for normal comparison and control in clinical research program: Secondary | ICD-10-CM

## 2024-11-01 ENCOUNTER — Other Ambulatory Visit: Payer: Self-pay

## 2024-11-01 ENCOUNTER — Other Ambulatory Visit (HOSPITAL_BASED_OUTPATIENT_CLINIC_OR_DEPARTMENT_OTHER): Payer: Self-pay

## 2024-11-01 DIAGNOSIS — R232 Flushing: Secondary | ICD-10-CM | POA: Diagnosis not present

## 2024-11-01 DIAGNOSIS — N926 Irregular menstruation, unspecified: Secondary | ICD-10-CM | POA: Diagnosis not present

## 2024-11-01 DIAGNOSIS — G4700 Insomnia, unspecified: Secondary | ICD-10-CM | POA: Diagnosis not present

## 2024-11-01 MED ORDER — BUSPIRONE HCL 10 MG PO TABS
10.0000 mg | ORAL_TABLET | Freq: Every day | ORAL | 3 refills | Status: DC
  Filled 2024-11-01: qty 60, 30d supply, fill #0
  Filled 2024-12-05: qty 60, 30d supply, fill #1

## 2024-11-17 ENCOUNTER — Ambulatory Visit: Attending: Cardiology | Admitting: Cardiology

## 2024-11-17 ENCOUNTER — Encounter: Payer: Self-pay | Admitting: Cardiology

## 2024-11-17 VITALS — BP 108/70 | HR 71 | Resp 16 | Ht 64.0 in | Wt 202.8 lb

## 2024-11-17 DIAGNOSIS — D259 Leiomyoma of uterus, unspecified: Secondary | ICD-10-CM | POA: Diagnosis not present

## 2024-11-17 DIAGNOSIS — R030 Elevated blood-pressure reading, without diagnosis of hypertension: Secondary | ICD-10-CM | POA: Diagnosis not present

## 2024-11-17 DIAGNOSIS — I2510 Atherosclerotic heart disease of native coronary artery without angina pectoris: Secondary | ICD-10-CM | POA: Diagnosis not present

## 2024-11-17 DIAGNOSIS — Z6834 Body mass index (BMI) 34.0-34.9, adult: Secondary | ICD-10-CM

## 2024-11-17 DIAGNOSIS — E66811 Obesity, class 1: Secondary | ICD-10-CM

## 2024-11-17 DIAGNOSIS — R931 Abnormal findings on diagnostic imaging of heart and coronary circulation: Secondary | ICD-10-CM | POA: Diagnosis not present

## 2024-11-17 DIAGNOSIS — E782 Mixed hyperlipidemia: Secondary | ICD-10-CM | POA: Diagnosis not present

## 2024-11-17 DIAGNOSIS — E6609 Other obesity due to excess calories: Secondary | ICD-10-CM

## 2024-11-17 DIAGNOSIS — G4733 Obstructive sleep apnea (adult) (pediatric): Secondary | ICD-10-CM

## 2024-11-17 NOTE — Patient Instructions (Signed)
 Medication Instructions:  Your physician recommends that you continue on your current medications as directed. Please refer to the Current Medication list given to you today.  *If you need a refill on your cardiac medications before your next appointment, please call your pharmacy*  Lab Work: None ordered If you have labs (blood work) drawn today and your tests are completely normal, you will receive your results only by: MyChart Message (if you have MyChart) OR A paper copy in the mail If you have any lab test that is abnormal or we need to change your treatment, we will call you to review the results.  Testing/Procedures: None ordered  Follow-Up: At [facility], you and your health needs are our priority.  As part of our continuing mission to provide you with exceptional heart care, our providers are all part of one team.  This team includes your primary Cardiologist (physician) and Advanced Practice Providers or APPs (Physician Assistants and Nurse Practitioners) who all work together to provide you with the care you need, when you need it.  Your next appointment:   1 year(s)  Provider:   Madonna Large, DO    We recommend signing up for the patient portal called MyChart.  Sign up information is provided on this After Visit Summary.  MyChart is used to connect with patients for Virtual Visits (Telemedicine).  Patients are able to view lab/test results, encounter notes, upcoming appointments, etc.  Non-urgent messages can be sent to your provider as well.   To learn more about what you can do with MyChart, go to [email or web address].

## 2024-11-17 NOTE — Progress Notes (Signed)
 Cardiology Office Note:  .   Date:  11/17/2024  ID:  Ruth Roberts, DOB September 05, 1966, MRN [id number] PCP:  Waylan Almarie SAUNDERS, MD  Former Cardiology Providers: None  [facility] Providers Cardiologist:  Madonna Large, DO , [facility] (established care 08/15/2021) Electrophysiologist:  None  Click to update primary MD,subspecialty MD or APP then REFRESH:1}    Chief Complaint  Patient presents with   Coronary artery calcification   Follow-up    History of Present Illness: .   Ruth Roberts is a 58 y.o. Caucasian female whose past medical history and cardiovascular risk factors includes: Mild coronary artery calcification, GERD, anxiety, obesity due to excess calories, hypertension (after COVID infection), former smoker, mild sleep apnea, Hx of COVID 19 infection.   Initially referred to the practice in 2022 for evaluation of palpitations and cardiovascular evaluation/screening.   Since establishing care she had an echocardiogram in September 2022 which noted preserved LVEF and no significant valvular heart disease. Her exercise treadmill stress test 08/2021 notable for possible stress-induced ischemia.  She underwent coronary CTA which noted mild CAC with a score of 17.2 placing her at the 90th percentile.  She had minimal nonobstructive CAD.  Shared decision was to work on improving her modifiable cardiovascular risk factors.  With lifestyle modifications patient's LDL levels improved from 121 mg/dL to 895 mg/dL and blood pressures also have improved.  At the last office visit recommended to follow-up on appearing basis or annually if she is felt to need to do so.  Since the last office visit patient followed up with Dr. Raford.  Was started on Crestor  10 mg p.o. daily to further optimize her lipids as her LDL levels were not at goal.  No additional changes were made.  Patient presents today for follow-up.  She has transitioned to menopause and having symptoms of hot flashes,  palpitations, weight gain, sleep disturbance, and brain fog. PCP recently started her on Buspar  at night. She is considering HRT.    She is concerned about cardiovascular risk given a coronary calcium  score in the ninetieth percentile for age and a recent LDL of 105 mg/dL. She previously improved her cholesterol with exercise and lifestyle changes. She no longer has elevated home blood pressure readings or palpitations since reducing sodium intake.  She is considering systemic hormone replacement therapy for menopause but is cautious because of potential cardiovascular risk and seeks guidance on safety given her elevated calcium  score and lipid profile.   Denies anginal chest pain or heart failure symptoms.  Review of Systems: .   Review of Systems  Cardiovascular:  Negative for chest pain, claudication, irregular heartbeat, leg swelling, near-syncope, orthopnea, palpitations, paroxysmal nocturnal dyspnea and syncope.  Respiratory:  Negative for shortness of breath.   Hematologic/Lymphatic: Negative for bleeding problem.    Studies Reviewed:   EKG: EKG Interpretation Date/Time:  Thursday November 17 2024 09:55:14 EST Ventricular Rate:  64 PR Interval:  172 QRS Duration:  88 QT Interval:  390 QTC Calculation: 402 R Axis:   75  Text Interpretation: Normal sinus rhythm Normal ECG When compared with ECG of 19-Feb-2024 08:43, No significant change was found Confirmed by Large Madonna [phone]) on 11/17/2024 10:02:09 AM  Echocardiogram: 08/2021: LVEF 60 to 65%, normal diastolic function, no significant valvular heart disease  Stress Testing: 08/22/2021: Stress ECG positive for ischemia.  Intermediate risk study.  Evaluating anginal chest pain with exercise, horizontal ST depressions in inferolateral leads which resolved within 1 minute into recovery and [facility] treadmill  score 0.65  Coronary CTA 09/16/2021: 1. Total coronary calcium  score of 17.2. This was 90th percentile for age and sex matched  control. 2. Normal coronary origin with right dominance. 3. CAD-RADS = 1 Minimal non-obstructive CAD. Proximal LAD minimal stenosis (0-24%) due to calcified plaque, mid to apical LAD is patent.  Otherwise left main, LCx, and RCA are patent. 4. No significant incidental noncardiac findings noted.   14 day extended Holter monitor: Dominant rhythm normal sinus, followed by sinus tachycardia (burden 11%). Heart rate 44-193 bpm. Avg HR 90, bpm. No atrial fibrillation, ventricular tachycardia, high grade AV block, pauses (3 seconds or longer). Rare episodes of paroxysmal supraventricular tachycardia. Total ventricular ectopic burden <1%. Total supraventricular ectopic burden <1%. Patient triggered events: 13 underlying rhythm is normal sinus without dysrhythmia.  RADIOLOGY: NA  Risk Assessment/Calculations:   The 10-year ASCVD risk score (Arnett DK, et al., 2019) is: 1.3%   Values used to calculate the score:     Age: 58 years     Clincally relevant sex: Female     Is Non-Hispanic African American: No     Diabetic: No     Tobacco smoker: No     Systolic Blood Pressure: 108 mmHg     Is BP treated: No     HDL Cholesterol: 105 mg/dL     Total Cholesterol: 181 mg/dL  Labs:    Lab Results  Component Value Date   CHOL 193 12/10/2021   HDL 63 12/10/2021   LDLCALC 121 (H) 12/10/2021   LDLDIRECT 121 (H) 12/10/2021   TRIG 47 12/10/2021   External Labs: Collected: 08/02/2022 provided by the patient performed by PCP. Total cholesterol 178, triglycerides 52, LDL 104, HDL 64. TSH 1.89. Sodium 140, potassium 3.9, chloride 104, bicarb 28, BUN 10, creatinine 0.6. AST 24, ALT 26, alkaline phosphatase 63. Hemoglobin 13.6 g/dL, hematocrit 59.3%  External Labs: Collected: 08/10/2024 [facility] database. Total cholesterol 181, HDL 59, triglycerides 82, LDL calculated 105. Hemoglobin 13 g/dL. Creatinine 0.62. Potassium 4.2. TSH 1.39  Physical Exam:    Today's Vitals   11/17/24 0952  BP:  108/70  Pulse: 71  Resp: 16  SpO2: 98%  Weight: 202 lb 12.8 oz (92 kg)  Height: 5' 4 (1.626 m)   Body mass index is 34.81 kg/m. Wt Readings from Last 3 Encounters:  11/17/24 202 lb 12.8 oz (92 kg)  02/19/24 201 lb 3.2 oz (91.3 kg)  01/12/24 195 lb (88.5 kg)    Physical Exam  Constitutional: No distress.  hemodynamically stable  Neck: No JVD present.  Cardiovascular: Normal rate, regular rhythm, S1 normal and S2 normal. Exam reveals no gallop, no S3 and no S4.  No murmur heard. Pulmonary/Chest: Effort normal and breath sounds normal. No stridor. She has no wheezes. She has no rales.  Musculoskeletal:        General: No edema.     Cervical back: Neck supple.  Skin: Skin is warm.     Impression & Recommendation(s):  Impression:   ICD-10-CM   1. Coronary artery calcification  I25.10 EKG 12-Lead    2. Agatston CAC score, <100  R93.1     3. Blood pressure elevated without history of HTN  R03.0     4. Mixed hyperlipidemia  E78.2     5. OSA (obstructive sleep apnea)  G47.33     6. Class 1 obesity due to excess calories without serious comorbidity with body mass index (BMI) of 34.0 to 34.9 in adult  E66.811  E66.09    Z68.34        Recommendation(s):  Coronary artery calcification Agatston CAC score, <100 Total CAC 17.2 AU, 90th percentile LDL levels improved from 121 mg/dL to 895 mg/dL with lifestyle modifications. Most recent LDL 105 mg/dL. Was started on Crestor  10 mg p.o. nightly in March 2025 when she followed with Dr. Raford to target a goal LDL <70 mg/dL.  However patient has not started rosuvastatin . Has undergone appropriate cardiovascular workup including: Echo, GXT, coronary CTA Reemphasized the importance of secondary prevention with focus on improving the modifiable cardiovascular risk factors such as glycemic control, lipid management, monitoring blood pressures, weight loss.  Blood pressure elevated without history of HTN In the past patient had  findings consistent stage I hypertension. She has implemented lifestyle modifications and her blood pressures have improved.  Mixed hyperlipidemia Initial LDL 121 mg/dL. With lifestyle modifications the improved to 104 mg/dL. LDL 105 mg/dL, August 2025 Objective data presented to the patient: mild CAC at the 90th percentile, 10-year risk of ASCVD less than 3%, I reviewed her prior LDL levels.  Since she is greater than 75th percentile would recommend a goal LDL <70 mg/dL this was also echoed by Dr. Raford in March 2025.  Patient remains hesitant to be on lipid-lowering agents.   Discussed lipid-lowering options including statins and nonstatin options like Zetia. Emphasized informed decision-making and potential plaque progression if untreated. Options discussed:  Continue conservative management keeping risks/benefits into consideration. Start either low-dose statin or nonstatin medications with follow-up labs in 6 weeks (FLP and CMP).  And being on the lowest dose needed to optimize her lipids going forward Repeat coronary calcium  score in 5 years to reevaluate disease progression - not recommended once patient are known to have CAC but could be considered. Patient requesting time to consider her options prior to making decision, agreed. She understands that she should not be rushed into making a decision. More than happy to answer her question or see her back sooner.  Other indices to consider - check Lpa with her next blood draw with PCP   IF she starts HRT would recommend check fasting lipids and Lpa in 3 months to re-evaluate her indices. Had a long discussion with the patient with regards to hormone replacement therapy and cardiovascular data.  It is not an absolute contraindicated but it has to be a shared decision between the patient and her provider based on her symptoms.   Class 1 obesity due to excess calories without serious comorbidity with body mass index (BMI) of 34.0 to 34.9 in  adult Body mass index is 34.81 kg/m. I reviewed with her importance of diet, regular physical activity/exercise, weight loss.   Patient is educated on the importance of increasing physical activity gradually as tolerated with a goal of moderate intensity exercise for 30 minutes a day 5 days a week.  Orders Placed:  Orders Placed This Encounter  Procedures   EKG 12-Lead   I personally spent a total of 40 minutes in the care of the patient today including preparing to see the patient, getting/reviewing separately obtained history, performing a medically appropriate exam/evaluation, counseling and educating, documenting clinical information in the EHR, independently interpreting results, coordinating care, and discussed lipid management, HRT and cardiovascular health, and answered all her questions to her satisfaction.   Final Medication List:   No orders of the defined types were placed in this encounter.   Medications Discontinued During This Encounter  Medication Reason   azelastine  (ASTELIN ) 0.1 %  nasal spray    levocetirizine (XYZAL ) 5 MG tablet Change in therapy   nirmatrelvir /ritonavir  (PAXLOVID , 300/100,) 20 x 150 MG & 10 x 100MG  TBPK Completed Course   predniSONE  (STERAPRED UNI-PAK 21 TAB) 5 MG (21) TBPK tablet Completed Course   pantoprazole  (PROTONIX ) 20 MG tablet Duplicate     Current Outpatient Medications:    albuterol  (VENTOLIN  HFA) 108 (90 Base) MCG/ACT inhaler, Inhale 1-2 puffs into the lungs every 6 (six) hours as needed for wheezing or shortness of breath., Disp: 20.1 g, Rfl: 2   ALPRAZolam (XANAX) 0.25 MG tablet, Take 0.25 mg by mouth daily as needed., Disp: , Rfl:    Azelastine  HCl 137 MCG/SPRAY SOLN, Place 1-2 sprays into both nostrils once to twice daily for nasal congestion, Disp: 30 mL, Rfl: 11   busPIRone  (BUSPAR ) 10 MG tablet, Take 1-2 tablets (10-20 mg total) by mouth at bedtime., Disp: 60 tablet, Rfl: 3   Calcium -Phosphorus-Vitamin D (CALCIUM /VITAMIN D3/ADULT  GUMMY PO), Take by mouth daily., Disp: , Rfl:    celecoxib (CELEBREX) 200 MG capsule, Take 1 capsule every day by oral route as needed for 30 days., Disp: , Rfl:    estradiol  (ESTRACE ) 0.1 MG/GM vaginal cream, Place 1 g vaginally daily., Disp: 42.5 g, Rfl: 11   famotidine  (PEPCID ) 20 MG tablet, Take 20 mg by mouth at bedtime., Disp: , Rfl:    Magnesium Glycinate 100 MG CAPS, Take by mouth., Disp: , Rfl:    montelukast  (SINGULAIR ) 10 MG tablet, Take 1 tablet (10 mg total) by mouth at bedtime., Disp: 90 tablet, Rfl: 3   Multiple Vitamin (MULTIVITAMIN) capsule, Take 1 capsule by mouth daily., Disp: , Rfl:    NON FORMULARY, Citrucell Fiber Supplement, Disp: , Rfl:    pantoprazole  (PROTONIX ) 20 MG tablet, Take 1 tablet (20 mg total) by mouth daily one-half hour to 1 hour before breakfast., Disp: 90 tablet, Rfl: 3   pantoprazole  (PROTONIX ) 40 MG tablet, Take 1 tablet (40 mg total) by mouth daily., Disp: 90 tablet, Rfl: 3   rosuvastatin  (CRESTOR ) 10 MG tablet, Take 1 tablet (10 mg total) by mouth daily., Disp: 90 tablet, Rfl: 3  Consent:   NA  Disposition:   1 year follow up   Her questions and concerns were addressed to her satisfaction. She voices understanding of the recommendations provided during this encounter.    Signed, Madonna Michele HAS, [facility]  A Division of [facility] [address], [place], [place] [address]  11/17/2024 10:57 AM

## 2024-11-22 ENCOUNTER — Other Ambulatory Visit (HOSPITAL_BASED_OUTPATIENT_CLINIC_OR_DEPARTMENT_OTHER): Payer: Self-pay

## 2024-11-22 DIAGNOSIS — N949 Unspecified condition associated with female genital organs and menstrual cycle: Secondary | ICD-10-CM | POA: Diagnosis not present

## 2024-11-22 DIAGNOSIS — G4700 Insomnia, unspecified: Secondary | ICD-10-CM | POA: Diagnosis not present

## 2024-11-22 DIAGNOSIS — N926 Irregular menstruation, unspecified: Secondary | ICD-10-CM | POA: Diagnosis not present

## 2024-11-22 DIAGNOSIS — I10 Essential (primary) hypertension: Secondary | ICD-10-CM | POA: Diagnosis not present

## 2024-11-22 MED ORDER — NORETHINDRONE 0.35 MG PO TABS
1.0000 | ORAL_TABLET | Freq: Every day | ORAL | 1 refills | Status: DC
  Filled 2024-11-22: qty 84, 84d supply, fill #0

## 2024-11-24 ENCOUNTER — Encounter (HOSPITAL_BASED_OUTPATIENT_CLINIC_OR_DEPARTMENT_OTHER): Admitting: Family

## 2024-11-24 ENCOUNTER — Ambulatory Visit: Admitting: Obstetrics

## 2024-11-24 ENCOUNTER — Encounter: Payer: Self-pay | Admitting: Obstetrics

## 2024-11-24 VITALS — BP 130/85 | HR 67 | Ht 64.0 in | Wt 202.2 lb

## 2024-11-24 DIAGNOSIS — N83209 Unspecified ovarian cyst, unspecified side: Secondary | ICD-10-CM | POA: Diagnosis not present

## 2024-11-24 DIAGNOSIS — Z6834 Body mass index (BMI) 34.0-34.9, adult: Secondary | ICD-10-CM | POA: Diagnosis not present

## 2024-11-24 DIAGNOSIS — N939 Abnormal uterine and vaginal bleeding, unspecified: Secondary | ICD-10-CM | POA: Diagnosis not present

## 2024-11-24 DIAGNOSIS — N393 Stress incontinence (female) (male): Secondary | ICD-10-CM | POA: Diagnosis not present

## 2024-11-24 DIAGNOSIS — R351 Nocturia: Secondary | ICD-10-CM | POA: Diagnosis not present

## 2024-11-24 DIAGNOSIS — N816 Rectocele: Secondary | ICD-10-CM | POA: Diagnosis not present

## 2024-11-24 LAB — POCT URINE DIPSTICK
Bilirubin, UA: NEGATIVE
Blood, UA: NEGATIVE
Glucose, UA: NEGATIVE mg/dL
Ketones, POC UA: NEGATIVE mg/dL
Leukocytes, UA: NEGATIVE
Nitrite, UA: NEGATIVE
POC PROTEIN,UA: NEGATIVE
Spec Grav, UA: 1.01 (ref 1.010–1.025)
Urobilinogen, UA: 0.2 U/dL
pH, UA: 6 (ref 5.0–8.0)

## 2024-11-24 NOTE — Assessment & Plan Note (Signed)
-   POCT negative, PVR 15mL - For treatment of stress urinary incontinence,  non-surgical options include expectant management, weight loss, physical therapy, as well as a pessary.  Surgical options include a midurethral sling, Burch urethropexy, and transurethral injection of a bulking agent. - referral to pelvic floor PT and optimize stool consistency

## 2024-11-24 NOTE — Assessment & Plan Note (Signed)
-   reports deviation of tampon with insertion and discomfort during intercourse - PVR 15mL - uses Citrucel 2 tablets/day with intermittent sensation of incomplete emptying  - For treatment of pelvic organ prolapse, we discussed options for management including expectant management, conservative management, and surgical management, such as Kegels, a pessary, pelvic floor physical therapy, and specific surgical procedures. - referral to pelvic floor PT due to prolapse and left sided pelvic floor myofascial pain - encouraged to continue titration of fiber supplementation and squatting position for defecation - encouraged splinting to assess sensation of incomplete emptying - repeat exam standing

## 2024-11-24 NOTE — Assessment & Plan Note (Addendum)
-   TVUS 11/17/24 with 8.2 x 3.8 x 4.8 cm anteverted uterus, 3.2 x 2.3 x 2.1 cm posterior fundal fibroid, ES 4mm, 4.1 x 4.9 x 1.9 cm anechoic lesion within the left adnexa - denies family history of breast or ovarian cancer, denies weight loss, abdominal distention - advised pt to seek urgent evaluation if clinical change - referred to GYN to establish care and ordered repeat TVUS in 6 months or earlier if clinical change

## 2024-11-24 NOTE — Patient Instructions (Addendum)
 You have a stage 1 (out of 4) prolapse.  We discussed the fact that it is not life threatening but there are several treatment options. For treatment of pelvic organ prolapse, we discussed options for management including expectant management, conservative management, and surgical management, such as Kegels, a pessary, pelvic floor physical therapy, and specific surgical procedures.     For night time frequency: - avoid fluid intake 3 hours before bedtime - due to snoring, consider sleep study to rule out sleep apnea  Bowel movements: Our goal is to achieve formed bowel movements daily or every-other-day.  You may need to try different combinations of the following options to find what works best for you - everybody's body works differently so feel free to adjust the dosages as needed.  Some options to help maintain bowel health include:  Dietary changes (more leafy greens, vegetables and fruits; less processed foods) Fiber supplementation (Benefiber, FiberCon, Metamucil or Psyllium). Start slow and increase gradually to full dose. Over-the-counter agents such as: stool softeners (Docusate or Colace) and/or laxatives (Miralax, milk of magnesia)  Power Pudding is a natural mixture that may help your constipation.  To make blend 1 cup applesauce, 1 cup wheat bran, and 3/4 cup prune juice, refrigerate and then take 1 tablespoon daily with a large glass of water as needed.   Women should try to eat at least 21 to 25 grams of fiber a day, while men should aim for 30 to 38 grams a day. You can add fiber to your diet with food or a fiber supplement such as psyllium (metamucil), benefiber, or fibercon.   Here's a look at how much dietary fiber is found in some common foods. When buying packaged foods, check the Nutrition Facts label for fiber content. It can vary among brands.  Fruits Serving size Total fiber (grams)*  Raspberries 1 cup 8.0  Pear 1 medium 5.5  Apple, with skin 1 medium 4.5  Banana 1  medium 3.0  Orange 1 medium 3.0  Strawberries 1 cup 3.0   Vegetables Serving size Total fiber (grams)*  Green peas, boiled 1 cup 9.0  Broccoli, boiled 1 cup chopped 5.0  Turnip greens, boiled 1 cup 5.0  Brussels sprouts, boiled 1 cup 4.0  Potato, with skin, baked 1 medium 4.0  Sweet corn, boiled 1 cup 3.5  Cauliflower, raw 1 cup chopped 2.0  Carrot, raw 1 medium 1.5   Grains Serving size Total fiber (grams)*  Spaghetti, whole-wheat, cooked 1 cup 6.0  Barley, pearled, cooked 1 cup 6.0  Bran flakes 3/4 cup 5.5  Quinoa, cooked 1 cup 5.0  Oat bran muffin 1 medium 5.0  Oatmeal, instant, cooked 1 cup 5.0  Popcorn, air-popped 3 cups 3.5  Brown rice, cooked 1 cup 3.5  Bread, whole-wheat 1 slice 2.0  Bread, rye 1 slice 2.0   Legumes, nuts and seeds Serving size Total fiber (grams)*  Split peas, boiled 1 cup 16.0  Lentils, boiled 1 cup 15.5  Black beans, boiled 1 cup 15.0  Baked beans, canned 1 cup 10.0  Chia seeds 1 ounce 10.0  Almonds 1 ounce (23 nuts) 3.5  Pistachios 1 ounce (49 nuts) 3.0  Sunflower kernels 1 ounce 3.0  *Rounded to nearest 0.5 gram. Source: [employer] for [employer], [employer]    For treatment of stress urinary incontinence, which is leakage with physical activity/movement/strainging/coughing, we discussed expectant management versus nonsurgical options versus surgery. Nonsurgical options include weight loss, physical therapy, as well as a  pessary.  Surgical options include a midurethral sling, which is a synthetic mesh sling that acts like a hammock under the urethra to prevent leakage of urine, a Burch urethropexy, and transurethral injection of a bulking agent.   Continue vaginal estrogen 1g twice a week.   Please return for endometrial biopsy

## 2024-11-24 NOTE — Assessment & Plan Note (Signed)
-   discussed association with pelvic floor disorders - encouraged weight reduction

## 2024-11-24 NOTE — Progress Notes (Signed)
 New Patient Evaluation and Consultation  Referring Provider: Waylan Almarie SAUNDERS, MD PCP: Waylan Almarie SAUNDERS, MD Date of Service: 11/24/2024  SUBJECTIVE Chief Complaint: New Patient (Initial Visit) (Cystocele/)  History of Present Illness: Ruth Roberts is a 58 y.o. White or Caucasian female seen in consultation at the request of Dr Waylan for evaluation of cystocele.    Vaginal cyst/bulge first evaluated 08/2023, however symptoms for around 5 years.  Peach size vaginal bulge inside the vagina, mostly noted when she inserts a tampon and it deviates to the left due to right sided bulge. Denies bulge past vaginal opening or splinting. Reports weight gain since menopause and constipation now managed on fiber supplementation Denies vaginal irritation, pain, or bleeding.  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  TRANSABDOMINAL EXAM:  TECHNIQUE: Multi-planar real-time grayscale ultrasound of the pelvis via a transabdominal approach was performed, supplemented by color and/or power Doppler for limited purposes of general vascularity evaluation.  FINDINGS:  .  Uterus: Further characterization as below. .  Right ovary/adnexa: More detailed characterization as discussed below. .  Left ovary/adnexa: More detailed characterization as discussed below. .  Bladder: Unremarkable. .  Peritoneum: No significant fluid or other abnormality identified.  TRANSVAGINAL EXAM:  TECHNIQUE: Multi-planar real-time grayscale ultrasound of the pelvis via a transvaginal approach was performed, supplemented by color and/or power Doppler for limited  purposes of general vascularity evaluation.  FINDINGS:  UTERUS: Anteverted. .  Size = 8.2 x 3.8 x 4.8 cm. Morphology .  Corpus: Unremarkable contour and echotexture.  There is a 3.2 x 2.3 x 2.1 cm posterior fundal fibroid. .  Endometrium: Width = 4 mm. .  Cervix: Visualized portions unremarkable. Nabothian cysts.  RIGHT Adnexa: .  Ovary: Size = 2.6 x 1.6 x 1.6 cm. No apparent suspicious lesions. General vascularity appears preserved. .  Other: No additional extra-ovarian abnormalities of note.  LEFT Adnexa: .  Ovary: Size = 2.5 x 1.9 x 1.1 cm. No apparent suspicious lesions. General vascularity appears preserved. .  Other: There is a 4.1 x 4.9 x 1.9 cm anechoic lesion within the left adnexa.  Peritoneum: No fluid in the pelvic cul-de-sac. Slide test: There is free movement of the visualized uterine and bowel surfaces. Other: None.  IMPRESSION: 1.  No acute findings of the imaged pelvis. 2.  There is a 4.9 cm left adnexal cyst. Recommend follow-up ultrasound in 6-12 months. 3.  Small uterine fibroid. Exam End: 11/17/24 14:09     Urinary Symptoms: Leaks urine with cough/ sneeze, laughing, and with a full bladder Leaks 2-3 time(s) per years in the past 2 years Denies Pad use Patient is not bothered by UI symptoms.  Day time voids 5.  Nocturia: 2 times per night to void with OSA managed with mouth appliance Started 2 years ago with urge to void at night Stop fluid intake right at bedtime Denies leg swelling Voiding dysfunction:  empties bladder well.  Patient does not use a catheter to empty bladder.  When urinating, patient feels a weak stream in the past 2 years Drinks: 30oz water per day, 16oz of coffee, 8oz caffeinated soda, 8oz of hot tea, 12oz carbonated water  UTIs: 0 UTI's in the last year.  Denies history of blood in urine, kidney or bladder stones, pyelonephritis, bladder cancer, and kidney cancer No results found for the last 90 days.   Pelvic Organ Prolapse  Symptoms:                  Patient Admits to a feeling of a bulge in the vaginal area. It has been present for 5 years.  Patient Denies seeing a bulge.  This bulge is bothersome.  Bowel Symptom: Bowel movements: 6-7 time(s) per week on Citrucel 2 tablets/day Stool consistency: soft , Type III stool mostly, intermittently Type I-II stool Straining: no.  Splinting: no.  Incomplete evacuation: yes. 1-2 x a week  Patient Denies accidental bowel leakage / fecal incontinence Bowel regimen: fiber, magnesium Last colonoscopy: Results none for review [facility] Colonoscopy          Upcoming     Colonoscopy (Every 10 Years) Next due on 07/07/2027    07/06/2017  Outside Claim: PR COLONOSCOPY FLX DX W/COLLJ SPEC WHEN PFRMD  Only the first 1 history entries have been loaded, but more history exists.               Sexual Function Sexually active: no.  Sexual orientation: Straight Pain with sex: Yes, has discomfort due to prolapse since onset of vaginal bulge Some relief with position change  Pelvic Pain Denies pelvic pain   Past Medical History:  Past Medical History:  Diagnosis Date   Coronary artery calcification    GERD (gastroesophageal reflux disease)    History of COVID-19    Hyperlipidemia    Hypertension 2022   Sinus trouble    Sleep apnea      Past Surgical History:   Past Surgical History:  Procedure Laterality Date   WISDOM TOOTH EXTRACTION       Past OB/GYN History: OB History  Gravida Para Term Preterm AB Living  0 0 0 0 0 0  SAB IAB Ectopic Multiple Live Births  0 0 0 0 0    Menopausal: No, LMP Patient's last menstrual period was 07/29/2024 (approximate). Contraception: n/a Last pap smear was 2024 per pt.  Any history of abnormal pap smears: no. No results found for: DIAGPAP, HPVHIGH, ADEQPAP  Medications: Patient has a current medication list which includes the following prescription(s): albuterol , alprazolam, azelastine  hcl, buspirone ,  calcium -phosphorus-vitamin d, celecoxib, estradiol , famotidine , magnesium glycinate, montelukast , multivitamin, NON FORMULARY, pantoprazole , pantoprazole , rosuvastatin , and norethindrone .   Allergies: Patient has no known allergies.   Social History: Social History[1]  Relationship status: long-term partner Patient lives alone.   Patient is employed as an pensions consultant for [employer]. Regular exercise: Yes: walks daily History of abuse: No  Family History:   Family History  Problem Relation Age of Onset   Lung cancer Mother    Rheum arthritis Mother    Scleroderma Mother    Lung cancer Father    Celiac disease Brother    Hypertension Maternal Aunt    Hypertension Maternal Grandmother    Stroke Maternal Grandfather    Breast cancer Other    Colon cancer Other    Hypertension Maternal Aunt      Review of Systems: Review of Systems  Constitutional:  Positive for malaise/fatigue. Negative for fever and weight loss.       Weight gain  Respiratory:  Negative for cough, shortness of breath and wheezing.   Cardiovascular:  Negative for chest pain, palpitations and leg swelling.  Gastrointestinal:  Negative for abdominal pain and blood in stool.  Genitourinary:  Positive for frequency (night time). Negative for dysuria, hematuria and urgency.  Skin:  Negative for rash.  Neurological:  Negative for dizziness, weakness and headaches.  Endo/Heme/Allergies:  Does not bruise/bleed easily.       Hot flashes  Psychiatric/Behavioral:  Negative for depression. The patient is not nervous/anxious.      OBJECTIVE Physical Exam: Vitals:   11/24/24 1253  BP: 130/85  Pulse: 67  Weight: 202 lb 3.2 oz (91.7 kg)  Height: 5' 4 (1.626 m)    Physical Exam Constitutional:      General: She is not in acute distress.    Appearance: Normal appearance.  Genitourinary:     Bladder and urethral meatus normal.     No lesions in the vagina.     Right Labia: No rash, tenderness, lesions, skin  changes or Bartholin's cyst.    Left Labia: No tenderness, lesions, skin changes, Bartholin's cyst or rash.    No vaginal discharge, erythema, tenderness, bleeding, ulceration or granulation tissue.     Posterior and apical vaginal prolapse present.    No vaginal atrophy present.     Right Adnexa: not tender, not full and no mass present.    Left Adnexa: not tender, not full and no mass present.    No cervical motion tenderness, discharge, friability, lesion, polyp or nabothian cyst.     Uterus is prolapsed.     Uterus is not enlarged, fixed, tender or irregular.     No uterine mass detected.    Urethral meatus caruncle not present.    No urethral prolapse, tenderness, mass, hypermobility, discharge or stress urinary incontinence with cough stress test present.     Bladder is not tender, urgency on palpation not present and masses not present.      Pelvic Floor: Levator muscle strength is 3/5.    Obturator internus is tender (left sided).     Levator ani not tender, no asymmetrical contractions present and no pelvic spasms present.    Symmetrical pelvic sensation, anal wink present and BC reflex present. Cardiovascular:     Rate and Rhythm: Normal rate.  Pulmonary:     Effort: Pulmonary effort is normal. No respiratory distress.  Abdominal:     General: There is no distension.     Palpations: Abdomen is soft. There is no mass.     Tenderness: There is no abdominal tenderness.     Hernia: No hernia is present.  Neurological:     Mental Status: She is alert.  Vitals reviewed. Exam conducted with a chaperone present.      POP-Q:   POP-Q  -3                                             Aa   -3                                            Ba   -7                                              C    2  Gh   4                                            Pb  10                                             tvl    -2                                             Ap   -2                                            Bp  -10                                               D    Post-Void Residual (PVR) by Bladder Scan: In order to evaluate bladder emptying, we discussed obtaining a postvoid residual and patient agreed to this procedure.  Procedure: The ultrasound unit was placed on the patient's abdomen in the suprapubic region after the patient had voided.    Post Void Residual - 11/24/24 1255       Post Void Residual   Post Void Residual 15 mL           Laboratory Results: Lab Results  Component Value Date   COLORU yellow 11/24/2024   CLARITYU clear 11/24/2024   GLUCOSEUR negative 11/24/2024   BILIRUBINUR negative 11/24/2024   SPECGRAV 1.010 11/24/2024   RBCUR negative 11/24/2024   PHUR 6.0 11/24/2024   UROBILINOGEN 0.2 11/24/2024   LEUKOCYTESUR Negative 11/24/2024    Lab Results  Component Value Date   CREATININE 0.69 12/10/2021   CREATININE 0.70 09/10/2021   CREATININE 0.56 07/30/2021    No results found for: HGBA1C  Lab Results  Component Value Date   HGB 13.1 07/30/2021     ASSESSMENT AND PLAN Ms. Saini is a 58 y.o. with:  1. Nocturia   2. Abnormal uterine bleeding (AUB)   3. Cyst of ovary, unspecified laterality   4. Pelvic organ prolapse quantification stage 1 rectocele   5. BMI 34.0-34.9,adult   6. SUI (stress urinary incontinence, female)     Nocturia Assessment & Plan: - avoid fluid intake 3 hours before bedtime - due to snoring, consider sleep study to rule out sleep apnea - discussed fluid management and caffeine reduction   Orders: -     POCT URINE DIPSTICK -     AMB referral to rehabilitation  Abnormal uterine bleeding (AUB) Assessment & Plan: - using vaginal estrogen 3x/week by Dr. Waylan - reports irregular cycles, LMP around 07/29/24  - TVUS 11/17/24 with 8.2 x 3.8 x 4.8 cm anteverted uterus, 3.2 x 2.3 x 2.1 cm posterior fundal fibroid, ES 4mm, 4.1 x 4.9 x 1.9 cm  anechoic lesion within the left adnexa - referred to GYN to establish care and ordered repeat TVUS  in 6 months or earlier if clinical change - return for EMB or proceed with GYN once she is established  Orders: -     Ambulatory referral to Gynecology  Cyst of ovary, unspecified laterality Assessment & Plan: - TVUS 11/17/24 with 8.2 x 3.8 x 4.8 cm anteverted uterus, 3.2 x 2.3 x 2.1 cm posterior fundal fibroid, ES 4mm, 4.1 x 4.9 x 1.9 cm anechoic lesion within the left adnexa - denies family history of breast or ovarian cancer, denies weight loss, abdominal distention - advised pt to seek urgent evaluation if clinical change - referred to GYN to establish care and ordered repeat TVUS in 6 months or earlier if clinical change  Orders: -     Ambulatory referral to Gynecology -     US  PELVIS TRANSVAGINAL NON-OB (TV ONLY); Future  Pelvic organ prolapse quantification stage 1 rectocele Assessment & Plan: - reports deviation of tampon with insertion and discomfort during intercourse - PVR 15mL - uses Citrucel 2 tablets/day with intermittent sensation of incomplete emptying  - For treatment of pelvic organ prolapse, we discussed options for management including expectant management, conservative management, and surgical management, such as Kegels, a pessary, pelvic floor physical therapy, and specific surgical procedures. - referral to pelvic floor PT due to prolapse and left sided pelvic floor myofascial pain - encouraged to continue titration of fiber supplementation and squatting position for defecation - encouraged splinting to assess sensation of incomplete emptying - repeat exam standing  Orders: -     AMB referral to rehabilitation  BMI 34.0-34.9,adult Assessment & Plan: - discussed association with pelvic floor disorders - encouraged weight reduction   SUI (stress urinary incontinence, female) Assessment & Plan: - POCT negative, PVR 15mL - For treatment of stress urinary  incontinence,  non-surgical options include expectant management, weight loss, physical therapy, as well as a pessary.  Surgical options include a midurethral sling, Burch urethropexy, and transurethral injection of a bulking agent. - referral to pelvic floor PT and optimize stool consistency   Time spent: I spent 66 minutes dedicated to the care of this patient on the date of this encounter to include pre-visit review of records, face-to-face time with the patient discussing stage I pelvic organ prolapse, AUB, nocturia, BMI, stress urinary incontinence, and post visit documentation and ordering medication/ testing.   Ruth ONEIDA Gillis, MD        [1]  Social History Tobacco Use   Smoking status: Former    Current packs/day: 0.00    Average packs/day: 0.3 packs/day for 25.0 years (6.3 ttl pk-yrs)    Types: Cigarettes    Start date: 87    Quit date: 2013    Years since quitting: 12.9   Smokeless tobacco: Never   Tobacco comments:    social   Vaping Use   Vaping status: Never Used  Substance Use Topics   Alcohol use: Yes    Comment: rare   Drug use: No

## 2024-11-24 NOTE — Assessment & Plan Note (Addendum)
-   using vaginal estrogen 3x/week by Dr. Waylan - reports irregular cycles, LMP around 07/29/24  - TVUS 11/17/24 with 8.2 x 3.8 x 4.8 cm anteverted uterus, 3.2 x 2.3 x 2.1 cm posterior fundal fibroid, ES 4mm, 4.1 x 4.9 x 1.9 cm anechoic lesion within the left adnexa - referred to GYN to establish care and ordered repeat TVUS in 6 months or earlier if clinical change - return for EMB or proceed with GYN once she is established

## 2024-11-24 NOTE — Assessment & Plan Note (Addendum)
-   avoid fluid intake 3 hours before bedtime - due to snoring, consider sleep study to rule out sleep apnea - discussed fluid management and caffeine reduction

## 2024-11-25 ENCOUNTER — Ambulatory Visit: Payer: Self-pay | Admitting: Obstetrics

## 2024-11-25 ENCOUNTER — Telehealth: Payer: Self-pay | Admitting: Cardiology

## 2024-11-25 NOTE — Telephone Encounter (Signed)
 Patient states she has concerns about a few medications and her increasing BP.  Rosuvastatin  (10 mg) was prescribed by Dr. Raford in March. She has not started yet, and would like to know if Dr. Michele agrees that this is the right medication for her cholesterol. If he agrees she will start taking it and has a bottle she filled in March available.  Patient stopped taking buspar  because she had been waking up feeling groggy, but since stopping it she has not been sleeping as well. She wonders if this may be contributing to her elevated BP readings.  Incassia  was prescribed by PCP to help with hot flashes. She has not started this yet and would like to know if Dr. Michele has any concerns from a cardiac standpoint about taking this medication, especially given her elevate BP readings.  Recent BP readings: 126/92 (this morning) 119/92 130/85 117/82 130/92 141/92  Will forward to Dr. Michele to review.

## 2024-11-25 NOTE — Telephone Encounter (Signed)
 Pt c/o medication issue:  1. Name of Medication:   rosuvastatin  (CRESTOR ) 10 MG tablet (Expired)  (Not started yet) busPIRone  (BUSPAR ) 10 MG tablet  (Patient stated she stopped taking this medication) Incassia , .35 mg (Not started as yet)  2. How are you currently taking this medication (dosage and times per day)?    3. Are you having a reaction (difficulty breathing--STAT)?   4. What is your medication issue?   Patient wants a call back to discuss these medications and noted her BP has been trending higher    Pt c/o BP issue: STAT if pt c/o blurred vision, one-sided weakness or slurred speech.  STAT if BP is GREATER than 180/120 TODAY.  STAT if BP is LESS than 90/60 and SYMPTOMATIC TODAY  1. What is your BP concern?   Patient is concerned her BP has been trending higher than usual and does not want to take medication that would increase her BP  2. Have you taken any BP medication today?  Not taking  3. What are your last 5 BP readings?  126/92 (this morning) 119/92 130/85 117/82 130/92 141/92  4. Are you having any other symptoms (ex. Dizziness, headache, blurred vision, passed out)?  Headache

## 2024-11-28 NOTE — Telephone Encounter (Signed)
 Left message for patient to call back

## 2024-11-28 NOTE — Therapy (Signed)
 OUTPATIENT PHYSICAL THERAPY FEMALE PELVIC EVALUATION   Patient Name: Ruth Roberts MRN: [id number] DOB:[birth year], 58 y.o., female Today's Date: 11/29/2024  END OF SESSION:  PT End of Session - 11/29/24 1550     Visit Number 1    Date for Recertification  05/30/25    Authorization Type [id number]  No auth req  60 vl    PT Start Time 1532    PT Stop Time 1615    PT Time Calculation (min) 43 min    Activity Tolerance Patient tolerated treatment well;No increased pain    Behavior During Therapy WFL for tasks assessed/performed          Past Medical History:  Diagnosis Date   Coronary artery calcification    GERD (gastroesophageal reflux disease)    History of COVID-19    Hyperlipidemia    Hypertension 2022   Sinus trouble    Sleep apnea    Past Surgical History:  Procedure Laterality Date   WISDOM TOOTH EXTRACTION     Patient Active Problem List   Diagnosis Date Noted   Nocturia 11/24/2024   Abnormal uterine bleeding (AUB) 11/24/2024   Cyst of ovary 11/24/2024   Pelvic organ prolapse quantification stage 1 rectocele 11/24/2024   BMI 34.0-34.9,adult 11/24/2024   SUI (stress urinary incontinence, female) 11/24/2024   Chronic rhinitis 01/14/2024   Deviated nasal septum 01/14/2024   Hypertrophy of nasal turbinates 01/14/2024   Precordial pain    OSA (obstructive sleep apnea) 07/15/2021   GERD (gastroesophageal reflux disease) 07/15/2021   Recurrent urticaria 12/20/2018   Perennial and seasonal allergic rhinosinusitis 12/20/2018   Allergic conjunctivitis 12/20/2018   Allergic bronchial hyperresponsiveness 12/20/2018    PCP: Waylan Almarie SAUNDERS, MD  REFERRING PROVIDER: Guadlupe Lianne DASEN, MD  REFERRING DIAG: R35.1 (ICD-10-CM) - Nocturia N81.6 (ICD-10-CM) - Pelvic organ prolapse quantification stage 1 rectocele   THERAPY DIAG:  Cramp and spasm  Other lack of coordination  Rationale for Evaluation and Treatment: Rehabilitation  ONSET DATE: 2020  SUBJECTIVE:                                                                                                                                                                                           58yo with stage I pelvic organ prolapse, nocturia, history of constipation SUBJECTIVE STATEMENT: Patient reports that her prolapse has been there for about 5 years. She is interested what PT will do.  No constipation anymore- taking Citrucel and it is helpful.  She does not have incontinence, no pain.  Some discomfort with intercourse- that is why she went to see Dr Guadlupe   Fluid intake: water,  a lot of  FUNCTIONAL LIMITATIONS: none  PERTINENT HISTORY:  Medications for current condition: none Surgeries: none Other: none Sexual abuse: No  PAIN: no pain- just some discomfort with intercourse  PRECAUTIONS: None  RED FLAGS: None   WEIGHT BEARING RESTRICTIONS: No  FALLS:  Has patient fallen in last 6 months? No  OCCUPATION: attorney   ACTIVITY LEVEL : walk   PLOF: Independent  PATIENT GOALS: wants to learn  about pelvic floor exercises   BOWEL MOVEMENT: no issues   URINATION:  Frequency:during the day no                                                        Nocturia: Yes: 1-2 drinks a lot of water    Leakage: none Pads/briefs: No  INTERCOURSE:  Ability to have vaginal penetration Yes  Pain with intercourse: During Penetration Dryness: No Climax: yes Marinoff Scale: 1/3 Lubricant:no  PREGNANCY: Vaginal deliveries no PROLAPSE: None   OBJECTIVE:  Note: Objective measures were completed at Evaluation unless otherwise noted.  DIAGNOSTIC FINDINGS:  Post-void residual: Voiding Cystourethrogram (VCUG):  Ultrasound:   PATIENT SURVEYS:    PFIQ-7:  UIQ-7  CRAIG -7  POPIQ-7  Female Sexual Function Index (FSFI) Questionnaire   COGNITION: Overall cognitive status: Within functional limits for tasks assessed     SENSATION: Light touch: Appears intact  LUMBAR SPECIAL  TESTS:  Slump test: Negative  FUNCTIONAL TESTS:   Single leg stance: within functional limitations bilat  Rt:  Lt: Sit-up test: Squat: Bed mobility:  GAIT: Assistive device utilized: None Comments: slightly antalgic  POSTURE: rounded shoulders and forward head   LUMBARAROM/PROM: full  LOWER EXTREMITY MNF:dnfz tightness throughout bilateral hips   LOWER EXTREMITY MMT: bilateral hips 4+/5 PALPATION:  General:   Pelvic Alignment: even  Abdominal:   Diastasis: No Distortion: No  Breathing: upper chest Scar tissue: No Active Straight Leg Raise: neg                External Perineal Exam: labia visible, mild dryness                             Internal Pelvic Floor: wants to contract when asked to push out  Patient confirms identification and approves PT to assess internal pelvic floor and treatment Yes All internal or external pelvic floor assessments and/or treatments are completed with proper hand hygiene and gloves hands. If needed gloves are changed with hand hygiene during patient care time.  PELVIC MMT:   MMT eval  Vaginal 3/5, 2s  Internal Anal Sphincter   External Anal Sphincter   Puborectalis   (Blank rows = not tested)        TONE: Average to high  PROLAPSE: Mild posterior vaginal wall laxity  TODAY'S TREATMENT:  DATE: 11/29/24  EVAL  Examination completed, findings reviewed, pt educated on POC, HEP, and female pelvic floor anatomy, reasoning with pelvic floor assessment internally with pt consent. Pt motivated to participate in PT and agreeable to attempt recommendations.     PATIENT EDUCATION/ there acts Education details: Pt was educated on relevant anatomy, exam findings, home exercise program, plan of care, expectations of PT and pelvic floor down training, using a vibrator for 5 mins/ day to reduce tone   Person  educated: Patient Education method: Explanation, Demonstration, Tactile cues, Verbal cues, and Handouts Education comprehension: verbalized understanding, returned demonstration, verbal cues required, tactile cues required, and needs further education  HOME EXERCISE PROGRAM: Access Code: [email or web address] URL: [email or web address] Date: 11/29/2024 Prepared by: Cori Avonte Sensabaugh  Exercises - Supine Butterfly Groin Stretch  - 1 x daily - 7 x weekly - 2 sets - 10 reps - Supine Lower Trunk Rotation  - 1 x daily - 7 x weekly - 2 sets - 10 reps - Diaphragmatic Breathing in Child's Pose with Pelvic Floor Relaxation  - 1 x daily - 7 x weekly - 2 sets - 10 reps - Butterfly Groin Stretch  - 1 x daily - 7 x weekly - 2 sets - 10 reps - Supine Pelvic Floor Stretch - Hands on Knees  - 1 x daily - 7 x weekly - 2 sets - 10 reps - Pelvic Floor Lengthening in Hooklying  - 1 x daily - 7 x weekly - 2 sets - 10 reps - Deep Squat with Pelvic Floor Relaxation  - 1 x daily - 7 x weekly - 2 sets - 10 reps - Diaphragmatic Breathing at 90/90 Supported  - 1 x daily - 7 x weekly - 2 sets - 10 reps  ASSESSMENT:  CLINICAL IMPRESSION: Patient is a 58 y.o. F who was seen today for physical therapy evaluation and treatment for dyspareunia. Patient reports that symptoms have been worrying her, she thought it was her rectocele that makes sex uncomfortable. She has been doing vaginal estrogen and is not very active, has been stressed some at work. . Patient reports that her partner is not new, she does not have a vibrator now but can get it, wants to try the stretches and is glad that nothing serious is going on.. Exam findings are notable for upper chest breathing strategies, abdominal weakness, pelvic floor muscle tightness and reduced coordination, average to high tone in pelvic floor. External soft tissues of pelvic floor appear a little dry. Patient demonstrates full trunk mobility, bilateral hip tightness,. It is difficult  for patient to have intercourse due to pain and discomfort . Discussed findings with patient, educated patient on pelvic floor down training and HEP was initiated. Patient's quality of life has been affected, patient is frustrated and will benefit from physical therapy to address deficits, reduce pain and improve tolerance to intercourse and quality of life.    OBJECTIVE IMPAIRMENTS: decreased knowledge of condition, decreased mobility, decreased ROM, decreased strength, increased muscle spasms, impaired tone, and pain.   ACTIVITY LIMITATIONS: intercourse  PARTICIPATION LIMITATIONS: interpersonal relationship  PERSONAL FACTORS: Fitness and Time since onset of injury/illness/exacerbation are also affecting patient's functional outcome.   REHAB POTENTIAL: Good  CLINICAL DECISION MAKING: Evolving/moderate complexity  EVALUATION COMPLEXITY: Moderate   GOALS: Goals reviewed with patient? Yes  Roberts TERM GOALS: Target date: 12/27/2024     Pt will be I and consistent with her HEP and demonstrate all exercises correctly  Baseline: Goal status: INITIAL  2.  Patient will report reduced pain with intercourse to max 3/10 Baseline:  Goal status: INITIAL  3.  Patient will use her vibrator or a dilator to reduce vaginal tone at least 2/ week for 3 mins Baseline:  Goal status: INITIAL  4.  Patient will be educated on healthy baldder habits  Baseline:  Goal status: INITIAL   LONG TERM GOALS: Target date: 05/30/2025  Patient will report no pain with intercourse Baseline:  Goal status: INITIAL  2.  Patient will get up max 1 time/ night to urinate Baseline:  Goal status: INITIAL  3.  Patient will dem all exercises correctly Baseline:  Goal status: INITIAL  4.  Patient will be able to perform correct pelvic floor contraction and relaxation Baseline:  Goal status: INITIAL    PLAN:  PT FREQUENCY: 1-2x/week  PT DURATION: 6 months  PLANNED INTERVENTIONS: 97110-Therapeutic  exercises, 97530- Therapeutic activity, 97112- Neuromuscular re-education, 97535- Self Care, 02859- Manual therapy, [phone]- Ionotophoresis 4mg /ml Dexamethasone, 20560 (1-2 muscles), 20561 (3+ muscles)- Dry Needling, Patient/Family education, Taping, Joint mobilization, Joint manipulation, Spinal manipulation, Spinal mobilization, Scar mobilization, Cryotherapy, Moist heat, and Biofeedback  PLAN FOR NEXT SESSION: diaphragmatic breathing reed, hip mobility, pelvic floor downtraining   Jauan Wohl, PT 11/29/2024, 5:07 PM

## 2024-11-28 NOTE — Telephone Encounter (Signed)
 We discussed this at length at the last visit.  We had discussed either conservative approach or starting low-dose rosuvastatin  as originally prescribed versus nonstatin options.  Patient was going to think this over Poor sleep can cause elevated blood pressures. No obvious contraindications, but will defer final decision to patient and the prescribing provider.  Dr. Lia Vigilante

## 2024-11-29 ENCOUNTER — Ambulatory Visit: Admitting: Physical Therapy

## 2024-11-29 ENCOUNTER — Other Ambulatory Visit: Payer: Self-pay

## 2024-11-29 ENCOUNTER — Encounter: Payer: Self-pay | Admitting: Physical Therapy

## 2024-11-29 DIAGNOSIS — R252 Cramp and spasm: Secondary | ICD-10-CM | POA: Diagnosis not present

## 2024-11-29 DIAGNOSIS — R278 Other lack of coordination: Secondary | ICD-10-CM | POA: Insufficient documentation

## 2024-11-29 NOTE — Telephone Encounter (Signed)
 Check Fasting lipids, CMP 6 weeks after starting Crestor .   Dr. Michele

## 2024-11-29 NOTE — Telephone Encounter (Signed)
 Patient is returning call.

## 2024-11-29 NOTE — Telephone Encounter (Signed)
 Spoke with pt. Recommendations given per Dr Michele. Pt has agreed to start crestor  10mg  daily this week. Advised pt possible need for lab work. Pt stated understand. Will forward to Dr Michele to advise if follow up labs will be needed for crestor  start.

## 2024-12-05 ENCOUNTER — Encounter (HOSPITAL_BASED_OUTPATIENT_CLINIC_OR_DEPARTMENT_OTHER): Payer: Self-pay

## 2024-12-05 ENCOUNTER — Other Ambulatory Visit (HOSPITAL_BASED_OUTPATIENT_CLINIC_OR_DEPARTMENT_OTHER): Payer: Self-pay

## 2024-12-05 MED ORDER — ESTRADIOL 0.01 % VA CREA
1.0000 g | TOPICAL_CREAM | Freq: Every day | VAGINAL | 8 refills | Status: DC
  Filled 2024-12-05: qty 42.5, 43d supply, fill #0

## 2024-12-29 ENCOUNTER — Ambulatory Visit (INDEPENDENT_AMBULATORY_CARE_PROVIDER_SITE_OTHER): Admitting: Obstetrics and Gynecology

## 2024-12-29 ENCOUNTER — Encounter: Payer: Self-pay | Admitting: Obstetrics and Gynecology

## 2024-12-29 ENCOUNTER — Other Ambulatory Visit (HOSPITAL_COMMUNITY)
Admission: RE | Admit: 2024-12-29 | Discharge: 2024-12-29 | Disposition: A | Source: Ambulatory Visit | Attending: Obstetrics and Gynecology | Admitting: Obstetrics and Gynecology

## 2024-12-29 ENCOUNTER — Other Ambulatory Visit (HOSPITAL_BASED_OUTPATIENT_CLINIC_OR_DEPARTMENT_OTHER): Payer: Self-pay

## 2024-12-29 VITALS — BP 118/72 | HR 85 | Temp 97.9°F | Ht 63.25 in | Wt 199.0 lb

## 2024-12-29 DIAGNOSIS — N951 Menopausal and female climacteric states: Secondary | ICD-10-CM | POA: Insufficient documentation

## 2024-12-29 DIAGNOSIS — Z124 Encounter for screening for malignant neoplasm of cervix: Secondary | ICD-10-CM

## 2024-12-29 DIAGNOSIS — Z79890 Hormone replacement therapy (postmenopausal): Secondary | ICD-10-CM | POA: Diagnosis not present

## 2024-12-29 DIAGNOSIS — N939 Abnormal uterine and vaginal bleeding, unspecified: Secondary | ICD-10-CM | POA: Diagnosis not present

## 2024-12-29 DIAGNOSIS — N83202 Unspecified ovarian cyst, left side: Secondary | ICD-10-CM

## 2024-12-29 MED ORDER — ESTRADIOL 0.025 MG/24HR TD PTTW
1.0000 | MEDICATED_PATCH | TRANSDERMAL | 2 refills | Status: AC
  Filled 2024-12-29: qty 8, 28d supply, fill #0

## 2024-12-29 MED ORDER — PROGESTERONE MICRONIZED 100 MG PO CAPS
100.0000 mg | ORAL_CAPSULE | Freq: Every day | ORAL | 0 refills | Status: AC
  Filled 2024-12-29: qty 90, 90d supply, fill #0

## 2024-12-29 NOTE — Progress Notes (Signed)
 "  59 y.o. G0P0000 female with AUB, ovarian cyst, rectocele, GSM, perimenopausal symptoms here for referral. 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  PCP: Waylan Almarie SAUNDERS, MD on [place]  Patient's last menstrual period was 07/15/2024 (approximate).   Ruth Roberts was referred for several complaints: - Abnormal uterine bleeding: Ruth Roberts has been having less frequent cycles over the past few years.  Ruth Roberts has not had a cycle since Aug 2025 but Ruth Roberts has not gone more than 12 months without a cycle.  When her bleeding occurs it is a light period that lasts for less than 7 days.  Ruth Roberts has no intermenstrual bleeding or pelvic cramping.  - Left cyst of ovary: Found incidentally on imaging for irregular periods and pelvic organ prolapse. No nausea or vomiting or abdominal pain.  - Hot flashes and interest in HRT: Ruth Roberts reports sleep disturbances, severe VMS, difficulty dealing with stress.  Taking estroven with minimal relief.  Ruth Roberts walks daily. Of note, Ruth Roberts had a high coronary calcium  score in 2022.  Ruth Roberts follows with cardiology, last seen December 2025, and was started on statin in Dec by cardiology. CV risk assessment <3% per recent cardiology note.  - Pt states Ruth Roberts is due for PAP  Contraception: abstinent, not sexually  Ruth Roberts was recently referred to urogyn for management of asymptomatic pelvic organ prolapse.  Ruth Roberts was started on PV estradiol  for GSM by PCP.  Ruth Roberts was referred to PFPT by urogyn for rectocele and SUI.  Ruth Roberts had initial therapy session.  Last mammogram: 09/12/24 BI-RADS 1, density B  GYN HISTORY: No sig hx  OB History  Gravida Para Term Preterm AB Living  0 0 0 0 0 0  SAB IAB Ectopic Multiple Live Births  0 0 0 0 0   Past Medical History:  Diagnosis Date   Coronary artery calcification    GERD (gastroesophageal reflux disease)    History of COVID-19    Hyperlipidemia    Hypertension 2022   Sinus trouble    Sleep apnea    Past Surgical History:  Procedure Laterality Date   COLPORRHAPHY      WISDOM TOOTH EXTRACTION     Medications Ordered Prior to Encounter[1] Allergies[2]    PE Today's Vitals   12/29/24 1534  BP: 118/72  Pulse: 85  Temp: 97.9 F (36.6 C)  TempSrc: Oral  SpO2: 99%  Weight: 199 lb (90.3 kg)  Height: 5' 3.25 (1.607 m)   Body mass index is 34.97 kg/m.  Physical Exam Vitals reviewed. Exam conducted with a chaperone present.  Constitutional:      General: Ruth Roberts is not in acute distress.    Appearance: Normal appearance.  HENT:     Head: Normocephalic and atraumatic.     Nose: Nose normal.  Eyes:     Extraocular Movements: Extraocular movements intact.     Conjunctiva/sclera: Conjunctivae normal.  Pulmonary:     Effort: Pulmonary effort is normal.  Genitourinary:    General: Normal vulva.     Exam position: Lithotomy position.     Vagina: Normal. No vaginal discharge.     Cervix: Normal. No cervical motion tenderness, discharge or lesion.     Uterus: Normal. Not enlarged and not tender.      Adnexa: Right adnexa normal and left adnexa normal.  Musculoskeletal:        General: Normal range of motion.     Cervical back: Normal range of motion.  Neurological:     General: No focal deficit present.     Mental  Status: Ruth Roberts is alert.  Psychiatric:        Mood and Affect: Mood normal.        Behavior: Behavior normal.     US  PELVIS TRANSABDOMINAL WITH TRANSVAG, 11/17/2024 2:09 PM  INDICATION: Irregular menstruation, unspecified FINDINGS:  UTERUS: Anteverted. .  Size = 8.2 x 3.8 x 4.8 cm. Morphology .  Corpus: Unremarkable contour and echotexture.  There is a 3.2 x 2.3 x 2.1 cm posterior fundal fibroid. .  Endometrium: Width = 4 mm. .  Cervix: Visualized portions unremarkable. Nabothian cysts.  RIGHT Adnexa: .  Ovary: Size = 2.6 x 1.6 x 1.6 cm. No apparent suspicious lesions. General vascularity appears preserved.  LEFT Adnexa: .  Ovary: Size = 2.5 x 1.9 x 1.1 cm. No apparent suspicious lesions. General vascularity appears preserved. .   Other: There is a 4.1 x 4.9 x 1.9 cm anechoic lesion within the left adnexa.  Peritoneum: No fluid in the pelvic cul-de-sac. Slide test: There is free movement of the visualized uterine and bowel surfaces. Other: None.  IMPRESSION: 1.  No acute findings of the imaged pelvis. 2.  There is a 4.9 cm left adnexal cyst. Recommend follow-up ultrasound in 6-12 months. 3.  Small uterine fibroid.   Assessment and Plan:        Abnormal uterine bleeding (AUB) Assessment & Plan: Patient with typical perimenopausal bleeding Given age, I will check a baseline hormonal panel ET 4 mm, on ultrasound however no indication to biopsy given premenopausal status.    Perimenopause Assessment & Plan: Briefly discussed management of VMS, including use of HRT, SSRI, and veozah. Discussed necessity of risk screening depending on class of treatment.  Patient is interested in HRT. Risk and benefits reviewed. Reviewed risk of breast cancer, DVT, and cardiovascular risk including stroke and MI. 62yr CV risk: <3%.  No sig fam hx of breast cancer Ruth Roberts is an appropriate candidate as Ruth Roberts is perimenopausal or it has been less than 10 years since menopause. Discussed topical estrogen carries a lower risk of DVT and will prescribe progestin for endometrial protection unless patient had had a hysterectomy. All questions answered. RTO in 3 months.   Orders: -     Follicle stimulating hormone -     Estradiol , Ultra Sens -     Testos,Total,Free and SHBG (Female)  Hormone replacement therapy (HRT) -     Progesterone ; Take 1 capsule (100 mg total) by mouth daily.  Dispense: 90 capsule; Refill: 0 -     Estradiol ; Place 1 patch onto the skin 2 (two) times a week.  Dispense: 8 patch; Refill: 2  Cervical cancer screening -     Cytology - PAP  Cyst of left ovary Assessment & Plan: Will check baseline CA125 and plan for f/u TVUS in 6 months  Orders: -     US  PELVIS TRANSVAGINAL NON-OB (TV ONLY); Future -     CA  125    Jacinda Kanady LULLA Pa, MD     [1]  Current Outpatient Medications on File Prior to Visit  Medication Sig Dispense Refill   albuterol  (VENTOLIN  HFA) 108 (90 Base) MCG/ACT inhaler Inhale 1-2 puffs into the lungs every 6 (six) hours as needed for wheezing or shortness of breath. 20.1 g 2   ALPRAZolam (XANAX) 0.25 MG tablet Take 0.25 mg by mouth daily as needed.     Azelastine  HCl 137 MCG/SPRAY SOLN Place 1-2 sprays into both nostrils once to twice daily for nasal congestion 30 mL 11  Calcium -Phosphorus-Vitamin D (CALCIUM /VITAMIN D3/ADULT GUMMY PO) Take by mouth daily.     celecoxib (CELEBREX) 200 MG capsule Take 1 capsule every day by oral route as needed for 30 days.     estradiol  (ESTRACE ) 0.1 MG/GM vaginal cream Place 1 g vaginally daily. 42.5 g 11   famotidine  (PEPCID ) 20 MG tablet Take 20 mg by mouth at bedtime.     Magnesium Glycinate 100 MG CAPS Take by mouth.     montelukast  (SINGULAIR ) 10 MG tablet Take 1 tablet (10 mg total) by mouth at bedtime. 90 tablet 3   Multiple Vitamin (MULTIVITAMIN) capsule Take 1 capsule by mouth daily.     NON FORMULARY Citrucell Fiber Supplement     Nutritional Supplements (ESTROVEN PO) Take by mouth.     pantoprazole  (PROTONIX ) 20 MG tablet Take 1 tablet (20 mg total) by mouth daily one-half hour to 1 hour before breakfast. 90 tablet 3   pantoprazole  (PROTONIX ) 40 MG tablet Take 1 tablet (40 mg total) by mouth daily. 90 tablet 3   rosuvastatin  (CRESTOR ) 10 MG tablet Take 1 tablet (10 mg total) by mouth daily. 90 tablet 3   No current facility-administered medications on file prior to visit.  [2] No Known Allergies  "

## 2024-12-29 NOTE — Assessment & Plan Note (Signed)
 Briefly discussed management of VMS, including use of HRT, SSRI, and veozah. Discussed necessity of risk screening depending on class of treatment.  Patient is interested in HRT. Risk and benefits reviewed. Reviewed risk of breast cancer, DVT, and cardiovascular risk including stroke and MI. 44yr CV risk: <3%.  No sig fam hx of breast cancer She is an appropriate candidate as she is perimenopausal or it has been less than 10 years since menopause. Discussed topical estrogen carries a lower risk of DVT and will prescribe progestin for endometrial protection unless patient had had a hysterectomy. All questions answered. RTO in 3 months.

## 2024-12-29 NOTE — Assessment & Plan Note (Signed)
 Patient with typical perimenopausal bleeding Given age, I will check a baseline hormonal panel ET 4 mm, on ultrasound however no indication to biopsy given premenopausal status.

## 2024-12-29 NOTE — Assessment & Plan Note (Signed)
 Will check baseline CA125 and plan for f/u TVUS in 6 months

## 2024-12-30 ENCOUNTER — Other Ambulatory Visit: Payer: Self-pay

## 2024-12-30 ENCOUNTER — Ambulatory Visit (INDEPENDENT_AMBULATORY_CARE_PROVIDER_SITE_OTHER): Admitting: Otolaryngology

## 2024-12-30 VITALS — BP 124/83 | HR 78 | Ht 63.0 in | Wt 199.0 lb

## 2024-12-30 DIAGNOSIS — J310 Chronic rhinitis: Secondary | ICD-10-CM | POA: Diagnosis not present

## 2024-12-30 DIAGNOSIS — J343 Hypertrophy of nasal turbinates: Secondary | ICD-10-CM | POA: Diagnosis not present

## 2024-12-30 DIAGNOSIS — J342 Deviated nasal septum: Secondary | ICD-10-CM

## 2024-12-30 DIAGNOSIS — R0981 Nasal congestion: Secondary | ICD-10-CM

## 2024-12-30 DIAGNOSIS — H6123 Impacted cerumen, bilateral: Secondary | ICD-10-CM | POA: Diagnosis not present

## 2024-12-30 NOTE — Progress Notes (Signed)
 Patient ID: Ruth Roberts, female   DOB: August 15, 1966, 59 y.o.   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  She reports acute onset of left ear blockage following the use of ear drops for presumed cerumen impaction, resulting in worsened obstruction and decreased hearing. The left ear is typically her better hearing ear, while the right ear is usually the one that pops with nose blowing. This episode was associated with a sensation of pressure and some discomfort in the right ear. She denies prior similar episodes, previous interventions, facial pain, or tenderness. She has no history of ear procedures.  She is also followed for persistent nasal obstruction, previously evaluated one year ago. Nasal congestion is variable and worsens during winter months, likely due to dry air. She manages symptoms with intranasal corticosteroids (previously Nasonex, now Flonase ), azelastine  nasal spray, and Allegra. She denies significant sneezing, ocular or nasal pruritus, or rhinorrhea, and does not have prominent allergic symptoms aside from congestion. She notes that nasal obstruction impacts her quality of life, particularly in winter.  Exam: General: Communicates without difficulty, well nourished, no acute distress. Head: Normocephalic, no evidence injury, no tenderness, facial buttresses intact without stepoff. Face/sinus: No tenderness to palpation and percussion. Facial movement is normal and symmetric. Eyes: PERRL, EOMI. No scleral icterus, conjunctivae clear. Neuro: CN II exam reveals vision grossly intact.  No nystagmus at any point of gaze. Ears: Auricles well formed without lesions.  Bilateral cerumen impaction.  Nose: External evaluation reveals normal support and skin without lesions.   Dorsum is intact.  Anterior rhinoscopy reveals congested mucosa over anterior aspect of inferior turbinates and deviated septum.  No purulence noted. Oral:  Oral cavity and oropharynx are intact, symmetric, without erythema or edema.  Mucosa is moist without lesions. Neck: Full range of motion without pain.  There is no significant lymphadenopathy.  No masses palpable.  Thyroid  bed within normal limits to palpation.  Parotid glands and submandibular glands equal bilaterally without mass.  Trachea is midline. Neuro:  CN 2-12 grossly intact.   Procedure: Bilateral cerumen disimpaction Anesthesia: None Description: Under the operating microscope, the cerumen is carefully removed with a combination of cerumen currette, alligator forceps, and suction catheters.  After the cerumen is removed, the TMs are noted to be normal.  No mass, erythema, or lesions. The patient tolerated the procedure well.    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  - Removed impacted cerumen from both ears during the visit, resulting in immediate improvement in hearing and relief of aural fullness. - Advised her to avoid inserting objects into the ear canal, including cotton-tipped applicators, to prevent recurrence. - Recommended cleaning only the external ear with a washcloth wrapped around her finger. - Discussed occasional use of cerumenolytic drops to soften wax if blockage recurs. - Scheduled follow-up in six months to reassess for recurrence.  Chronic rhinitis with nasal mucosal congestion, nasal septal deviation, and bilateral inferior turbinate hypertrophy Chronic nasal obstruction and congestion due to septal deviation and inferior turbinate hypertrophy, with fluctuating symptoms and winter exacerbation. Current regimen of intranasal  corticosteroid, antihistamine spray, and oral antihistamine provides partial relief. No significant allergic  symptoms aside from congestion.  - Advised continuation of current medical management with Flonase , azelastine , and Allegra. - Discussed definitive surgical options, including septoplasty and turbinate reduction, with details of procedure, recovery, and anticipated outcomes. - Reviewed postoperative care: avoidance of heavy lifting for three to four days, use of ice packs, and acetaminophen for discomfort. - Explained that surgery may allow discontinuation of some medications if obstruction is the primary indication, but will not address underlying allergies. - Scheduled follow-up in six months to reassess symptoms and discuss timing of surgical intervention.

## 2025-01-02 ENCOUNTER — Telehealth: Payer: Self-pay

## 2025-01-02 ENCOUNTER — Other Ambulatory Visit (HOSPITAL_BASED_OUTPATIENT_CLINIC_OR_DEPARTMENT_OTHER): Payer: Self-pay

## 2025-01-02 LAB — TESTOS,TOTAL,FREE AND SHBG (FEMALE)
Free Testosterone: 2.5 pg/mL (ref 0.1–6.4)
Sex Hormone Binding: 73 nmol/L (ref 14–73)
Testosterone, Total, LC-MS-MS: 35 ng/dL (ref 2–45)

## 2025-01-02 LAB — CYTOLOGY - PAP
Comment: NEGATIVE
Diagnosis: NEGATIVE
High risk HPV: NEGATIVE

## 2025-01-02 LAB — FOLLICLE STIMULATING HORMONE: FSH: 122.4 m[IU]/mL — ABNORMAL HIGH

## 2025-01-02 LAB — CA 125: CA 125: 11 U/mL

## 2025-01-02 NOTE — Telephone Encounter (Signed)
 Patient is postponing her biopsy for now; states she is starting HRT and will have follow up ultrasound with Dr. Dallie.   This is an FYI

## 2025-01-04 ENCOUNTER — Encounter: Payer: Self-pay | Admitting: Cardiology

## 2025-01-04 ENCOUNTER — Ambulatory Visit: Payer: Self-pay | Admitting: Obstetrics and Gynecology

## 2025-01-04 ENCOUNTER — Encounter: Payer: Self-pay | Admitting: Obstetrics and Gynecology

## 2025-01-04 DIAGNOSIS — I2510 Atherosclerotic heart disease of native coronary artery without angina pectoris: Secondary | ICD-10-CM

## 2025-01-04 DIAGNOSIS — R931 Abnormal findings on diagnostic imaging of heart and coronary circulation: Secondary | ICD-10-CM

## 2025-01-04 DIAGNOSIS — E782 Mixed hyperlipidemia: Secondary | ICD-10-CM

## 2025-01-06 LAB — ESTRADIOL, ULTRA SENS: Estradiol, Ultra Sensitive: 10 pg/mL

## 2025-01-20 LAB — COMPREHENSIVE METABOLIC PANEL WITH GFR
ALT: 19 [IU]/L (ref 0–32)
AST: 20 [IU]/L (ref 0–40)
Albumin: 4.5 g/dL (ref 3.8–4.9)
Alkaline Phosphatase: 67 [IU]/L (ref 49–135)
BUN/Creatinine Ratio: 16 (ref 9–23)
BUN: 11 mg/dL (ref 6–24)
Bilirubin Total: 0.4 mg/dL (ref 0.0–1.2)
CO2: 22 mmol/L (ref 20–29)
Calcium: 9.7 mg/dL (ref 8.7–10.2)
Chloride: 103 mmol/L (ref 96–106)
Creatinine, Ser: 0.69 mg/dL (ref 0.57–1.00)
Globulin, Total: 2.3 g/dL (ref 1.5–4.5)
Glucose: 76 mg/dL (ref 70–99)
Potassium: 4.3 mmol/L (ref 3.5–5.2)
Sodium: 140 mmol/L (ref 134–144)
Total Protein: 6.8 g/dL (ref 6.0–8.5)
eGFR: 102 mL/min/{1.73_m2}

## 2025-01-20 LAB — LIPID PANEL
Chol/HDL Ratio: 2 ratio (ref 0.0–4.4)
Cholesterol, Total: 138 mg/dL (ref 100–199)
HDL: 68 mg/dL
LDL Chol Calc (NIH): 59 mg/dL (ref 0–99)
Triglycerides: 50 mg/dL (ref 0–149)
VLDL Cholesterol Cal: 11 mg/dL (ref 5–40)

## 2025-01-27 ENCOUNTER — Ambulatory Visit: Admitting: Obstetrics

## 2025-04-06 ENCOUNTER — Ambulatory Visit: Admitting: Obstetrics and Gynecology

## 2025-06-01 ENCOUNTER — Other Ambulatory Visit: Admitting: Obstetrics and Gynecology

## 2025-06-01 ENCOUNTER — Other Ambulatory Visit

## 2025-06-30 ENCOUNTER — Ambulatory Visit (INDEPENDENT_AMBULATORY_CARE_PROVIDER_SITE_OTHER): Admitting: Otolaryngology
